# Patient Record
Sex: Male | Born: 1965 | Race: White | Hispanic: No | Marital: Married | Smoking: Never smoker
Health system: Southern US, Community
[De-identification: ages and names within clinical notes are randomized; demographics above are authoritative.]

## PROBLEM LIST (undated history)

## (undated) DIAGNOSIS — E1110 Type 2 diabetes mellitus with ketoacidosis without coma: Secondary | ICD-10-CM

## (undated) DIAGNOSIS — E119 Type 2 diabetes mellitus without complications: Secondary | ICD-10-CM

## (undated) DIAGNOSIS — I10 Essential (primary) hypertension: Secondary | ICD-10-CM

## (undated) DIAGNOSIS — R011 Cardiac murmur, unspecified: Secondary | ICD-10-CM

## (undated) DIAGNOSIS — B019 Varicella without complication: Secondary | ICD-10-CM

## (undated) DIAGNOSIS — F32A Depression, unspecified: Secondary | ICD-10-CM

## (undated) DIAGNOSIS — N200 Calculus of kidney: Secondary | ICD-10-CM

## (undated) DIAGNOSIS — F329 Major depressive disorder, single episode, unspecified: Secondary | ICD-10-CM

## (undated) DIAGNOSIS — E785 Hyperlipidemia, unspecified: Secondary | ICD-10-CM

## (undated) HISTORY — DX: Depression, unspecified: F32.A

## (undated) HISTORY — PX: CYSTOSCOPY WITH STENT PLACEMENT: SHX5790

## (undated) HISTORY — PX: RHINOPLASTY: SHX2354

## (undated) HISTORY — DX: Type 2 diabetes mellitus with ketoacidosis without coma: E11.10

## (undated) HISTORY — DX: Type 2 diabetes mellitus without complications: E11.9

## (undated) HISTORY — DX: Major depressive disorder, single episode, unspecified: F32.9

## (undated) HISTORY — DX: Calculus of kidney: N20.0

## (undated) HISTORY — DX: Varicella without complication: B01.9

## (undated) HISTORY — DX: Essential (primary) hypertension: I10

## (undated) HISTORY — DX: Cardiac murmur, unspecified: R01.1

## (undated) HISTORY — DX: Hyperlipidemia, unspecified: E78.5

## (undated) HISTORY — PX: ROTATOR CUFF REPAIR: SHX139

---

## 2014-10-24 DIAGNOSIS — E1110 Type 2 diabetes mellitus with ketoacidosis without coma: Secondary | ICD-10-CM

## 2014-10-24 HISTORY — DX: Type 2 diabetes mellitus with ketoacidosis without coma: E11.10

## 2015-08-12 ENCOUNTER — Encounter: Payer: Self-pay | Admitting: Primary Care

## 2015-08-12 ENCOUNTER — Ambulatory Visit (INDEPENDENT_AMBULATORY_CARE_PROVIDER_SITE_OTHER): Payer: BLUE CROSS/BLUE SHIELD | Admitting: Primary Care

## 2015-08-12 VITALS — BP 144/84 | HR 73 | Temp 98.0°F | Ht 70.0 in | Wt 163.8 lb

## 2015-08-12 DIAGNOSIS — I10 Essential (primary) hypertension: Secondary | ICD-10-CM | POA: Insufficient documentation

## 2015-08-12 DIAGNOSIS — Z794 Long term (current) use of insulin: Secondary | ICD-10-CM | POA: Diagnosis not present

## 2015-08-12 DIAGNOSIS — E119 Type 2 diabetes mellitus without complications: Secondary | ICD-10-CM

## 2015-08-12 DIAGNOSIS — E785 Hyperlipidemia, unspecified: Secondary | ICD-10-CM | POA: Diagnosis not present

## 2015-08-12 MED ORDER — INSULIN GLARGINE 100 UNIT/ML ~~LOC~~ SOLN: 20.0000 [IU] | Freq: Two times a day (BID) | SUBCUTANEOUS | Status: DC

## 2015-08-12 MED ORDER — LOSARTAN POTASSIUM 50 MG PO TABS: 50.0000 mg | ORAL_TABLET | Freq: Every day | ORAL | Status: DC

## 2015-08-12 NOTE — Patient Instructions (Signed)
Refills have been provided for your Insulin and Losartan.  Check your blood pressure daily, for the next 2 weeks. Ensure that you have rested for 30 minutes prior to checking your blood pressure. Call in/or send in your readings on My Chart in 2 weeks.  Follow up in 3 months for recheck of blood pressure and diabetes.   Please notify me if your hand numbness/pain does not resolve.  It was a pleasure to meet you today! Please don't hesitate to call me with any questions. Welcome to [facility]!

## 2015-08-12 NOTE — Assessment & Plan Note (Signed)
Long standing history.  Managed on Losartan 50 mg, but has been out for 2 months. BP elevated today, refills provided. He is to check his BP for the next 2 weeks and send them to me on My Chart. Will obtain records for lab work

## 2015-08-12 NOTE — Progress Notes (Signed)
Pre visit review using our clinic review tool, if applicable. No additional management support is needed unless otherwise documented below in the visit note. 

## 2015-08-12 NOTE — Assessment & Plan Note (Signed)
Managed on Simvastatin 40 mg.  Endorses normal lipid panel this summer. Will obtain records. Denies myalgias.

## 2015-08-12 NOTE — Assessment & Plan Note (Signed)
Diagnosed in 2009. Currently managed on Lantus 20 units BID but has been out since Sunday this week. Last eye exam in 11/2014, no diabetic retinopathy per patient. Managed on ARB and statin. Refills provided for insulin. Will do A1C in 3 months as he's been out of his insulin. Will obtain records.
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
## 2015-08-21 ENCOUNTER — Encounter: Payer: Self-pay | Admitting: Primary Care

## 2015-08-21 MED ORDER — "INSULIN SYRINGE 30G X 5/16"" 1 ML MISC": 0.2000 mL | Freq: Two times a day (BID) | Status: DC

## 2015-08-31 ENCOUNTER — Encounter: Payer: Self-pay | Admitting: Primary Care

## 2015-08-31 ENCOUNTER — Ambulatory Visit (INDEPENDENT_AMBULATORY_CARE_PROVIDER_SITE_OTHER)
Admission: RE | Admit: 2015-08-31 | Discharge: 2015-08-31 | Disposition: A | Discharge status: Home / Self Care | Payer: BLUE CROSS/BLUE SHIELD | Source: Ambulatory Visit | Attending: Primary Care | Admitting: Primary Care

## 2015-08-31 ENCOUNTER — Ambulatory Visit (INDEPENDENT_AMBULATORY_CARE_PROVIDER_SITE_OTHER): Payer: BLUE CROSS/BLUE SHIELD | Admitting: Primary Care

## 2015-08-31 VITALS — BP 132/86 | HR 92 | Temp 98.2°F | Wt 167.4 lb

## 2015-08-31 DIAGNOSIS — M25432 Effusion, left wrist: Secondary | ICD-10-CM | POA: Diagnosis not present

## 2015-08-31 DIAGNOSIS — M79641 Pain in right hand: Secondary | ICD-10-CM

## 2015-08-31 LAB — URIC ACID: URIC ACID, SERUM: 3.2 mg/dL — AB (ref 4.0–7.8)

## 2015-08-31 LAB — RHEUMATOID FACTOR: Rhuematoid fact SerPl-aCnc: <10 IU/mL (ref <=14)

## 2015-08-31 MED ORDER — GLUCOSE BLOOD VI STRP: ORAL_STRIP | Status: DC

## 2015-08-31 NOTE — Patient Instructions (Signed)
Complete xray(s) prior to leaving today. I will contact you regarding your results.  Schedule an appointment to see Dr. Patsy Lageropland for evaluation of probable trigger finger to the left hand.  Continue applying heat. Obtain a brace to help support your wrist and hand. Continue advil as needed.  It was a pleasure to see you today!  Trigger Finger Trigger finger (digital tendinitis and stenosing tenosynovitis) is a common disorder that causes an often painful catching of the fingers or thumb. It occurs as a clicking, snapping, or locking of a finger in the palm of the hand. This is caused by a problem with the tendons that flex or bend the fingers sliding smoothly through their sheaths. The condition may occur in any finger or a couple fingers at the same time.  The finger may lock with the finger curled or suddenly straighten out with a snap. This is more common in patients with rheumatoid arthritis and diabetes. Left untreated, the condition may get worse to the point where the finger becomes locked in flexion, like making a fist, or less commonly locked with the finger straightened out. CAUSES   Inflammation and scarring that lead to swelling around the tendon sheath.  Repeated or forceful movements.  Rheumatoid arthritis, an autoimmune disease that affects joints.  Gout.  Diabetes mellitus. SIGNS AND SYMPTOMS  Soreness and swelling of your finger.  A painful clicking or snapping as you bend and straighten your finger. DIAGNOSIS  Your health care provider will do a physical exam of your finger to diagnose trigger finger. TREATMENT   Splinting for 6-8 weeks may be helpful.  Nonsteroidal anti-inflammatory medicines (NSAIDs) can help to relieve the pain and inflammation.  Cortisone injections, along with splinting, may speed up recovery. Several injections may be required. Cortisone may give relief after one injection.  Surgery is another treatment that may be used if conservative  treatments do not work. Surgery can be minor, without incisions (a cut does not have to be made), and can be done with a needle through the skin.  Other surgical choices involve an open procedure in which the surgeon opens the hand through a small incision and cuts the pulley so the tendon can again slide smoothly. Your hand will still work fine. HOME CARE INSTRUCTIONS  Apply ice to the injured area, twice per day:  Put ice in a plastic bag.  Place a towel between your skin and the bag.  Leave the ice on for 20 minutes, 3-4 times a day.  Rest your hand often. MAKE SURE YOU:   Understand these instructions.  Will watch your condition.  Will get help right away if you are not doing well or get worse.   This information is not intended to replace advice given to you by your health care provider. Make sure you discuss any questions you have with your health care provider.   Document Released: 07/30/2004 Document Revised: 06/12/2013 Document Reviewed: 03/12/2013 Elsevier Interactive Patient Education [employer]2016 Elsevier Inc.

## 2015-08-31 NOTE — Progress Notes (Signed)
Pre visit review using our clinic review tool, if applicable. No additional management support is needed unless otherwise documented below in the visit note. 

## 2015-08-31 NOTE — Progress Notes (Signed)
Subjective:    Patient ID: Dakota Sutton, male    DOB: September 22, 1966, 49 y.o.   MRN: [id number]  HPI  Mr. Dakota Sutton is a 49 year old male who presents today with a chief complaint of wrist pain. His pain is located to his left wrist and right hand including his first digit. He "banged up" his left wrist in June this summer. He jammed his right thumb about one month ago at work and has noticed pain and swelling since. He's been soaking in warm bath tub, applying Aspercreme, taking advil, and applying heat with temporary improvement. He cannot open a soda bottle or hold a pen. Denies recent injury/trauma.  Review of Systems  Musculoskeletal: Positive for myalgias, joint swelling and arthralgias.  Skin: Negative for color change.       Past Medical History  Diagnosis Date  . Chicken pox   . Depression   . Diabetes mellitus without complication (HCC)   . Heart murmur   . Hyperlipidemia   . Hypertension   . Kidney stone   . Diabetic ketoacidosis (HCC) 10/2014    Hospitalized in [place]     Social History   Social History  . Marital Status: Married    Spouse Name: N/A  . Number of Children: N/A  . Years of Education: N/A   Occupational History  . Not on file.   Social History Main Topics  . Smoking status: Never Smoker   . Smokeless tobacco: Not on file  . Alcohol Use: 0.0 oz/week    0 Standard drinks or equivalent per week     Comment: social  . Drug Use: Not on file  . Sexual Activity: Not on file   Other Topics Concern  . Not on file   Social History Narrative   Moved from [place].   MBA. Works as a Production designer, theatre/television/film.   1 child that lives in [place].   Enjoys bowling, softball.       No past surgical history on file.  Family History  Problem Relation Age of Onset  . Heart disease Father     No Known Allergies  Current Outpatient Prescriptions on File Prior to Visit  Medication Sig Dispense Refill  . fluticasone (FLONASE) 50 MCG/ACT nasal spray Place 2 sprays into both  nostrils as needed for allergies or rhinitis.    Marland Kitchen insulin glargine (LANTUS) 100 UNIT/ML injection Inject 0.2 mLs (20 Units total) into the skin 2 (two) times daily. 10 mL 11  . Insulin Syringe-Needle U-100 (INSULIN SYRINGE 1CC/30GX5/16") 30G X 5/16" 1 ML MISC Inject 0.2 mLs as directed 2 (two) times daily. 100 each 0  . losartan (COZAAR) 50 MG tablet Take 1 tablet (50 mg total) by mouth daily. 90 tablet 1  . omeprazole (PRILOSEC) 20 MG capsule Take 20 mg by mouth daily.    . simvastatin (ZOCOR) 40 MG tablet Take 40 mg by mouth daily.     No current facility-administered medications on file prior to visit.    BP 132/86 mmHg  Pulse 92  Temp(Src) 98.2 F (36.8 C) (Oral)  Wt 167 lb 6.4 oz (75.932 kg)  SpO2 96%    Objective:   Physical Exam  Constitutional: He appears well-nourished.  Cardiovascular: Normal rate and regular rhythm.   Pulmonary/Chest: Effort normal and breath sounds normal.  Musculoskeletal:       Left wrist: He exhibits decreased range of motion, tenderness and swelling. He exhibits no bony tenderness.  Decrease in ROM to right 1st digit. Mild  swelling circumferentially to left wrist.  Skin: Skin is warm and dry. No erythema.          Assessment & Plan:  Wrist Pain:  Located to left wrist since June 2016. Difficulty opening bottles, holding pen, contraction of left second digit. Suspect trigger finger, will have him schedule an appointment with Dr. Patsy Lageropland for evaluation as conservative measures are not helping. Will obtain uric acid, ANA, RF to rule out autoimmune causes due to inflammation.  Thumb pain:  Located to right thumb since jamming it 1 month ago. Continues to have decrease ROM and swelling. Will obtain xray today and he does have some swelling and decrease in ROM.

## 2015-09-01 ENCOUNTER — Encounter: Payer: Self-pay | Admitting: *Deleted

## 2015-09-01 LAB — ANA: ANA: NEGATIVE

## 2015-09-02 ENCOUNTER — Ambulatory Visit (INDEPENDENT_AMBULATORY_CARE_PROVIDER_SITE_OTHER): Payer: BLUE CROSS/BLUE SHIELD | Admitting: Family Medicine

## 2015-09-02 VITALS — BP 118/80 | HR 84 | Temp 97.5°F | Ht 70.0 in | Wt 166.0 lb

## 2015-09-02 DIAGNOSIS — M6530 Trigger finger, unspecified finger: Secondary | ICD-10-CM

## 2015-09-02 DIAGNOSIS — M659 Synovitis and tenosynovitis, unspecified: Secondary | ICD-10-CM

## 2015-09-02 DIAGNOSIS — M6588 Other synovitis and tenosynovitis, other site: Secondary | ICD-10-CM

## 2015-09-02 DIAGNOSIS — M654 Radial styloid tenosynovitis [de Quervain]: Secondary | ICD-10-CM

## 2015-09-02 DIAGNOSIS — E119 Type 2 diabetes mellitus without complications: Secondary | ICD-10-CM

## 2015-09-02 DIAGNOSIS — Z794 Long term (current) use of insulin: Secondary | ICD-10-CM

## 2015-09-02 MED ORDER — DICLOFENAC SODIUM 1 % TD GEL: 2.0000 g | Freq: Four times a day (QID) | TRANSDERMAL | Status: DC

## 2015-09-02 MED ORDER — METHYLPREDNISOLONE ACETATE 40 MG/ML IJ SUSP
20.0000 mg | Freq: Once | INTRAMUSCULAR | Status: AC
  Administered 2015-09-02: 20 mg via INTRA_ARTICULAR

## 2015-09-02 NOTE — Progress Notes (Signed)
Pre visit review using our clinic review tool, if applicable. No additional management support is needed unless otherwise documented below in the visit note. 

## 2015-09-02 NOTE — Patient Instructions (Signed)
Bag of rice (you can also use sand) in a big bucket.  Practice your grips - particularly extension of fingers  For a few minutes each day  (Can look up on youtube)

## 2015-09-02 NOTE — Progress Notes (Signed)
Dr. Karleen Hampshire T. Denetria Luevanos, MD, CAQ Sports Medicine Primary [facility] and Sports Medicine [address] [place], [address] Phone: [phone] Fax: [phone]  09/02/2015  Dakota Sutton: Dakota Sutton, MRN: [id number], DOB: [birth year], 49 y.o.  Primary Physician:  Morrie Sheldon, NP   Chief Complaint  Dakota Sutton presents with  . Wrist Pain    Bilateral  . Trigger Finger    Left Pinky   Subjective:   Dear Dakota Sutton:  Thank you for having me see Dakota Sutton in consultation today at [facility] for Dakota Sutton problem with B hand pain.  As you may recall, Dakota Sutton is a 49 y.o. year old male with a history of B hand pain, worse about the thumbs. Dakota Sutton recently moved here from [place], and Dakota Sutton is doing a much more physical job that Dakota Sutton was in [place]. Dakota Sutton is using Dakota Sutton hands more and is primarly having pain on the dorsum of the L thumb and volarly on the R thumb.   A 5th digit trigger finger was found when you saw Dakota Sutton in the office too, and that is not really painful or bothering Dakota Sutton much right now.   Dakota Sutton has tried some OTC NSAIDS, tylenol, ice, heat, but no splinting, no PT. No history of surgery.   Dakota Sutton does relate a history of using some type of machine at work, and since then it has been bothering Dakota Sutton more in the hands.    Dakota Sutton Active Problem List   Diagnosis Date Noted  . Type 2 diabetes mellitus without complication, with long-term current use of insulin (HCC) 08/12/2015  . Essential hypertension 08/12/2015  . Hyperlipidemia LDL goal <100 08/12/2015    Past Medical History  Diagnosis Date  . Chicken pox   . Depression   . Diabetes mellitus without complication (HCC)   . Heart murmur   . Hyperlipidemia   . Hypertension   . Kidney stone   . Diabetic ketoacidosis (HCC) 10/2014    Hospitalized in [place]     No past surgical history on file.  Social History   Social History  . Marital Status: Married    Spouse Name: N/A  . Number of Children: N/A  . Years  of Education: N/A   Occupational History  . Not on file.   Social History Main Topics  . Smoking status: Never Smoker   . Smokeless tobacco: Not on file  . Alcohol Use: 0.0 oz/week    0 Standard drinks or equivalent per week     Comment: social  . Drug Use: Not on file  . Sexual Activity: Not on file   Other Topics Concern  . Not on file   Social History Narrative   Moved from [place].   MBA. Works as a Production designer, theatre/television/film.   1 child that lives in [place].   Enjoys bowling, softball.       Family History  Problem Relation Age of Onset  . Heart disease Father     No Known Allergies  Medication list reviewed and updated in full in [facility].   GEN: No fevers, chills. Nontoxic. Primarily MSK c/o today. MSK: Detailed in the HPI GI: tolerating PO intake without difficulty Neuro: No numbness, parasthesias, or tingling associated. Otherwise the pertinent positives of the ROS are noted above.   Objective:   BP 118/80 mmHg  Pulse 84  Temp(Src) 97.5 F (36.4 C) (Oral)  Ht  (1.778 m)  Wt 166 lb (75.297 kg)  BMI 23.82  kg/m2    GEN: WDWN, NAD, Non-toxic, A & O x 3 HEENT: Atraumatic, Normocephalic. Neck supple. No masses, No LAD. Ears and Nose: No external deformity. CV: RRR, No M/G/R. No JVD. No thrill. No extra heart sounds. PULM: CTA B, no wheezes, crackles, rhonchi. No retractions. No resp. distress. No accessory muscle use. EXTR: No c/c/e NEURO Normal gait.  PSYCH: Normally interactive. Conversant. Not depressed or anxious appearing.  Calm demeanor.    Hand: B Ecchymosis or edema: neg ROM wrist/hand/digits/elbow: TERMINAL MOTION OF WRIST CAUSES SOME PAIN Carpals, MCP's, digits: NT Distal Ulna and Radius: NT Supination lift test: neg Ecchymosis or edema: neg Cysts/nodules: L 5TH WITH SOME MODERATE TRIGGERING Finkelstein's test: POS ON LEFT Snuffbox tenderness: neg Scaphoid tubercle: NT Hook of Hamate: NT Resisted supination: NT Full composite fist Grip, all  digits: 5/5 str PAIN ALONG THE 1ST R FLEXOR TENDON Axial load test: neg Phalen's: neg Tinel's: neg Atrophy: neg  Hand sensation: intact   Radiology:  Dg Hand Complete Right  08/31/2015  CLINICAL DATA:  Right thumb and hand pain since injury 1 month ago. EXAM: RIGHT HAND - COMPLETE 3+ VIEW COMPARISON:  None. FINDINGS: No acute fracture or dislocation. Joint spaces are maintained for age. IMPRESSION: No acute osseous abnormality. Electronically Signed   By: Jeronimo GreavesKyle  Talbot M.D.   On: 08/31/2015 09:04    Assessment and Plan:   Flexor tenosynovitis of thumb  Tenosynovitis of finger - Plan: methylPREDNISolone acetate (DEPO-MEDROL) injection 20 mg  De Quervain's tenosynovitis, left  Trigger finger, left  Type 2 diabetes mellitus without complication, with long-term current use of insulin (HCC)   Suspect all overuse since starting a more manual job with 1st volar compartmental tenosynovitis of the R and De Quervain's primarily on the LEFT. Inject the R 1st flexor sheath.   Thumb spica splint for left, begin rehab, and if Dakota Sutton does poorly, we can inject, but I would like to use with US assistance.   Work on classic [employer] rehab.  R 1st tendon sheath injection, thumb Verbal consent was obtained. Risks (including rare risk of infection, potential risk for skin lightening and potential atrophy), benefits and alternatives were discussed. Prepped with Chloraprep and Ethyl Chloride used for anesthesia. Under sterile conditions, Dakota Sutton injected at palmar crease aiming distally with 45 degree angle; injected directly into tendon sheath. Medication flowed freely without resistance.  Needle size: 22 gauge 1 1/2 inch Injection: 1/2 cc of Lidocaine 1% and Depo-Medrol 20 mg   Follow-up: 1 mo  New Prescriptions   DICLOFENAC SODIUM (VOLTAREN) 1 % GEL    Apply 2 g topically 4 (four) times daily.   Dakota Sutton Instructions  Bag of rice (you can also use sand) in a big bucket.  Practice your grips -  particularly extension of fingers  For a few minutes each day  (Can look up on youtube)    Thank you for having us see Dakota Sutton in consultation.  Feel free to contact me with any questions.  Signed,  Elpidio GaleaSpencer T. Brittan Mapel, MD   Dakota Sutton's Medications  New Prescriptions   DICLOFENAC SODIUM (VOLTAREN) 1 % GEL    Apply 2 g topically 4 (four) times daily.  Previous Medications   FLUTICASONE (FLONASE) 50 MCG/ACT NASAL SPRAY    Place 2 sprays into both nostrils as needed for allergies or rhinitis.   GLUCOSE BLOOD TEST STRIP    Use as instructed to test glucose daily.   INSULIN GLARGINE (LANTUS) 100 UNIT/ML INJECTION    Inject 0.2  mLs (20 Units total) into the skin 2 (two) times daily.   INSULIN SYRINGE-NEEDLE U-100 (INSULIN SYRINGE 1CC/30GX5/16") 30G X 5/16" 1 ML MISC    Inject 0.2 mLs as directed 2 (two) times daily.   LOSARTAN (COZAAR) 50 MG TABLET    Take 1 tablet (50 mg total) by mouth daily.   OMEPRAZOLE (PRILOSEC) 20 MG CAPSULE    Take 20 mg by mouth daily.   SIMVASTATIN (ZOCOR) 40 MG TABLET    Take 40 mg by mouth daily.  Modified Medications   No medications on file  Discontinued Medications   No medications on file

## 2015-09-03 ENCOUNTER — Telehealth: Payer: Self-pay

## 2015-09-03 NOTE — Telephone Encounter (Signed)
PA forms placed in Dr. Cyndie Chimeopland's in box to complete.

## 2015-09-03 NOTE — Telephone Encounter (Signed)
done

## 2015-09-03 NOTE — Telephone Encounter (Signed)
Pt left v/m; pt spoke with ins co and request call to ins prior auth dept [id number]. Voltaren gel requires PA.

## 2015-09-03 NOTE — Telephone Encounter (Signed)
[facility]

## 2015-09-04 ENCOUNTER — Encounter: Payer: Self-pay | Admitting: Family Medicine

## 2015-09-04 NOTE — Telephone Encounter (Signed)
PA form completed and [phone].

## 2015-09-07 NOTE — Telephone Encounter (Signed)
Voltaren Gel Approved.  Effective 09/04/2015-10/23/2038.  Mr. Dakota Sutton notified.

## 2015-09-08 ENCOUNTER — Encounter: Payer: Self-pay | Admitting: Primary Care

## 2015-09-09 MED ORDER — INSULIN GLARGINE 100 UNIT/ML SOLOSTAR PEN
20.0000 [IU] | PEN_INJECTOR | Freq: Every day | SUBCUTANEOUS | Status: DC
Start: 2015-09-09 — End: 2015-09-10

## 2015-09-09 NOTE — Addendum Note (Signed)
Addended by: Sueanne MargaritaSMITH, Hristopher Missildine L on: 09/09/2015 07:48 AM   Modules accepted: Orders, Medications

## 2015-09-10 ENCOUNTER — Encounter: Payer: Self-pay | Admitting: Primary Care

## 2015-09-10 ENCOUNTER — Telehealth: Payer: Self-pay

## 2015-09-10 ENCOUNTER — Other Ambulatory Visit: Payer: Self-pay | Admitting: *Deleted

## 2015-09-10 MED ORDER — INSULIN PEN NEEDLE 32G X 4 MM MISC: Status: DC

## 2015-09-10 MED ORDER — INSULIN GLARGINE 100 UNIT/ML SOLOSTAR PEN: 20.0000 [IU] | PEN_INJECTOR | Freq: Two times a day (BID) | SUBCUTANEOUS | Status: DC

## 2015-09-10 NOTE — Telephone Encounter (Signed)
Dakota Sutton (DPR signed) left v/m that lantus was sent in and wants updated instructions for 20 U bid; [facility]. I spoke with Dakota Sutton and advised done and also sent in pen needles that were advised by [facility] pharmacist. Galen DaftJanette appreciative and will pick up med for pt at [facility] [address].

## 2015-10-21 ENCOUNTER — Encounter: Payer: Self-pay | Admitting: Primary Care

## 2015-10-23 MED ORDER — OMEPRAZOLE 20 MG PO CPDR: 20.0000 mg | DELAYED_RELEASE_CAPSULE | Freq: Every day | ORAL | Status: DC

## 2015-10-23 MED ORDER — FLUTICASONE PROPIONATE 50 MCG/ACT NA SUSP: 2.0000 | NASAL | Status: DC | PRN

## 2015-10-23 MED ORDER — INSULIN PEN NEEDLE 30G X 8 MM MISC: 1.0000 | Status: DC | PRN

## 2015-10-23 NOTE — Telephone Encounter (Signed)
Okay to refill needles as requested.

## 2015-10-23 NOTE — Telephone Encounter (Signed)
Pt called and when he gets 5 pens at [facility] he is charged 2 copays because 5 pens is 5 weeks of med and pharmacy will not break a box of pens; cost to pt is $200.00. Pt request to increase the dosage of lantus even thou pt will not take the increased dose (pt will continue taking the 20 U bid) so that his co pay will only be $100.00. Advised pt that would be illegal to do that; pt said OK then he just will not take the lantus. (pt is presently out of lantus; pt still has humalog and will take humalog if cannot get lantus. FBS was running in the 200's over holiday;now pt is back home and FBS this morning was 78. Pt said when not on vacation FBS usually runs in 130's. Pt request new rx for Novofine 30G x 8mm disposable needles. Pt does not want the 32G 4 mm on med list.Please advise. Refilled x 1 the flonase and omeprazole to [facility] [address]. Pt has appt to see Mayra ReelKate Clark NP on 11/12/15. Pt established care 08/12/15.pt request cb. Allayne GitelmanK Clark NP out of office until 10/27/15.

## 2015-10-27 MED ORDER — FLUTICASONE PROPIONATE 50 MCG/ACT NA SUSP: 2.0000 | Freq: Every day | NASAL | Status: AC | PRN

## 2015-10-28 ENCOUNTER — Telehealth: Payer: Self-pay

## 2015-10-28 MED ORDER — INSULIN PEN NEEDLE 30G X 8 MM MISC: Status: DC

## 2015-10-28 NOTE — Telephone Encounter (Signed)
Pt said the instructions on the pen needles are not correct. Pt is taking 20 U insulin twice a day; apologized to pt and sending new rx to [facility] [address].[facility] to [facility] other rx for pen needles. Pt voiced understanding.

## 2015-11-02 ENCOUNTER — Emergency Department (HOSPITAL_COMMUNITY): Payer: BLUE CROSS/BLUE SHIELD

## 2015-11-02 ENCOUNTER — Emergency Department (HOSPITAL_COMMUNITY)
Admission: EM | Admit: 2015-11-02 | Discharge: 2015-11-02 | Disposition: A | Discharge status: Home / Self Care | Payer: BLUE CROSS/BLUE SHIELD | Attending: Emergency Medicine | Admitting: Emergency Medicine

## 2015-11-02 ENCOUNTER — Encounter (HOSPITAL_COMMUNITY): Payer: Self-pay | Admitting: *Deleted

## 2015-11-02 DIAGNOSIS — I10 Essential (primary) hypertension: Secondary | ICD-10-CM | POA: Insufficient documentation

## 2015-11-02 DIAGNOSIS — E119 Type 2 diabetes mellitus without complications: Secondary | ICD-10-CM | POA: Diagnosis not present

## 2015-11-02 DIAGNOSIS — Z87442 Personal history of urinary calculi: Secondary | ICD-10-CM | POA: Insufficient documentation

## 2015-11-02 DIAGNOSIS — E785 Hyperlipidemia, unspecified: Secondary | ICD-10-CM | POA: Diagnosis not present

## 2015-11-02 DIAGNOSIS — Z791 Long term (current) use of non-steroidal anti-inflammatories (NSAID): Secondary | ICD-10-CM | POA: Diagnosis not present

## 2015-11-02 DIAGNOSIS — R000 Tachycardia, unspecified: Secondary | ICD-10-CM | POA: Diagnosis not present

## 2015-11-02 DIAGNOSIS — R011 Cardiac murmur, unspecified: Secondary | ICD-10-CM | POA: Insufficient documentation

## 2015-11-02 DIAGNOSIS — Z79899 Other long term (current) drug therapy: Secondary | ICD-10-CM | POA: Diagnosis not present

## 2015-11-02 DIAGNOSIS — Z794 Long term (current) use of insulin: Secondary | ICD-10-CM | POA: Insufficient documentation

## 2015-11-02 DIAGNOSIS — Z8659 Personal history of other mental and behavioral disorders: Secondary | ICD-10-CM | POA: Insufficient documentation

## 2015-11-02 DIAGNOSIS — R0602 Shortness of breath: Secondary | ICD-10-CM | POA: Diagnosis not present

## 2015-11-02 DIAGNOSIS — R110 Nausea: Secondary | ICD-10-CM | POA: Insufficient documentation

## 2015-11-02 DIAGNOSIS — R61 Generalized hyperhidrosis: Secondary | ICD-10-CM | POA: Diagnosis not present

## 2015-11-02 DIAGNOSIS — Z8619 Personal history of other infectious and parasitic diseases: Secondary | ICD-10-CM | POA: Diagnosis not present

## 2015-11-02 LAB — I-STAT CHEM 8, ED
BUN: 24 mg/dL — AB (ref 6–20)
CALCIUM ION: 1.12 mmol/L (ref 1.12–1.23)
CHLORIDE: 106 mmol/L (ref 101–111)
CREATININE: 1.3 mg/dL — AB (ref 0.61–1.24)
GLUCOSE: 142 mg/dL — AB (ref 65–99)
HCT: 51 % (ref 39.0–52.0)
Hemoglobin: 17.3 g/dL — ABNORMAL HIGH (ref 13.0–17.0)
Potassium: 3.7 mmol/L (ref 3.5–5.1)
Sodium: 142 mmol/L (ref 135–145)
TCO2: 20 mmol/L (ref 0–100)

## 2015-11-02 LAB — COMPREHENSIVE METABOLIC PANEL
ALK PHOS: 90 U/L (ref 38–126)
ALT: 19 U/L (ref 17–63)
ANION GAP: 12 (ref 5–15)
AST: 22 U/L (ref 15–41)
Albumin: 3.8 g/dL (ref 3.5–5.0)
BILIRUBIN TOTAL: 0.8 mg/dL (ref 0.3–1.2)
BUN: 21 mg/dL — ABNORMAL HIGH (ref 6–20)
CALCIUM: 9.5 mg/dL (ref 8.9–10.3)
CO2: 21 mmol/L — ABNORMAL LOW (ref 22–32)
Chloride: 108 mmol/L (ref 101–111)
Creatinine, Ser: 1.45 mg/dL — ABNORMAL HIGH (ref 0.61–1.24)
GFR, EST NON AFRICAN AMERICAN: 54 mL/min — AB (ref >60)
Glucose, Bld: 146 mg/dL — ABNORMAL HIGH (ref 65–99)
Potassium: 3.8 mmol/L (ref 3.5–5.1)
SODIUM: 141 mmol/L (ref 135–145)
TOTAL PROTEIN: 6.8 g/dL (ref 6.5–8.1)

## 2015-11-02 LAB — CBC
HCT: 47.3 % (ref 39.0–52.0)
Hemoglobin: 16.7 g/dL (ref 13.0–17.0)
MCH: 31.9 pg (ref 26.0–34.0)
MCHC: 35.3 g/dL (ref 30.0–36.0)
MCV: 90.4 fL (ref 78.0–100.0)
PLATELETS: 258 10*3/uL (ref 150–400)
RBC: 5.23 MIL/uL (ref 4.22–5.81)
RDW: 12.8 % (ref 11.5–15.5)
WBC: 7.8 10*3/uL (ref 4.0–10.5)

## 2015-11-02 LAB — CBG MONITORING, ED: GLUCOSE-CAPILLARY: 155 mg/dL — AB (ref 65–99)

## 2015-11-02 LAB — I-STAT TROPONIN, ED: Troponin i, poc: 0 ng/mL (ref 0.00–0.08)

## 2015-11-02 MED ORDER — IOHEXOL 350 MG/ML SOLN
100.0000 mL | Freq: Once | INTRAVENOUS | Status: AC | PRN
  Administered 2015-11-02: 75 mL via INTRAVENOUS

## 2015-11-02 MED ORDER — SODIUM CHLORIDE 0.9 % IV BOLUS (SEPSIS)
1000.0000 mL | Freq: Once | INTRAVENOUS | Status: AC
  Administered 2015-11-02: 1000 mL via INTRAVENOUS

## 2015-11-02 NOTE — ED Notes (Signed)
Pt ambulated down hallway with O2 sats remaining above 97% the entire time. Gait steady. Denies shortness of breath.

## 2015-11-02 NOTE — ED Provider Notes (Signed)
CSN: [id number]     Arrival date & time 11/02/15  0759 History   First MD Initiated Contact with Patient 11/02/15 0801     Chief Complaint  Patient presents with  . Shortness of Breath     (Consider location/radiation/quality/duration/timing/severity/associated sxs/prior Treatment) HPI Dakota Sutton is a 50 y.o. male with a history of hypertension, hyperlipidemia, diabetes, comes in for evaluation of short of breath. Patient reports that approximately 10 after 5 this morning, while getting out of the shower he suddenly felt like "I ran a marathon" and reports sudden shortness of breath. He had associated nausea, diaphoresis. No syncope. Denies any chest pain, vomiting, cough, hemoptysis, leg pain or swelling, numbness or weakness, vision changes, headache. He reports a similar episode happened last week, lasted approximately 20 minutes. Patient is still currently short of breath in the emergency department. He does report flying to [place] on December 20, flying back on December 26. Nonsmoker. Reports father had a history of mitral valve prolapse, but no other first degree cardiopulmonary problems.  Past Medical History  Diagnosis Date  . Chicken pox   . Depression   . Diabetes mellitus without complication (HCC)   . Heart murmur   . Hyperlipidemia   . Hypertension   . Kidney stone   . Diabetic ketoacidosis (HCC) 10/2014    Hospitalized in [place]    History reviewed. No pertinent past surgical history. Family History  Problem Relation Age of Onset  . Heart disease Father    Social History  Substance Use Topics  . Smoking status: Never Smoker   . Smokeless tobacco: None  . Alcohol Use: 0.0 oz/week    0 Standard drinks or equivalent per week     Comment: social    Review of Systems A 10 point review of systems was completed and was negative except for pertinent positives and negatives as mentioned in the history of present illness     Allergies  Review of patient's  allergies indicates no known allergies.  Home Medications   Prior to Admission medications   Medication Sig Start Date End Date Taking? Authorizing Provider  Cinnamon 500 MG TABS Take 500 mg by mouth daily.   Yes Historical Provider, MD  diclofenac sodium (VOLTAREN) 1 % GEL Apply 2 g topically 4 (four) times daily. 09/02/15  Yes Spencer Copland, MD  fluticasone (FLONASE) 50 MCG/ACT nasal spray Place 2 sprays into both nostrils daily as needed for allergies or rhinitis. 10/27/15  Yes Doreene NestKatherine K Clark, NP  glucose blood test strip Use as instructed to test glucose daily. 08/31/15  Yes Doreene NestKatherine K Clark, NP  Insulin Glargine (LANTUS SOLOSTAR) 100 UNIT/ML Solostar Pen Inject 20 Units into the skin 2 (two) times daily. 09/10/15  Yes Karie Schwalbeichard I Letvak, MD  Insulin Pen Needle (NOVOFINE) 30G X 8 MM MISC Pt is to inject 20 Units of insulin twice a day. 10/28/15  Yes Doreene NestKatherine K Clark, NP  losartan (COZAAR) 50 MG tablet Take 1 tablet (50 mg total) by mouth daily. 08/12/15  Yes Doreene NestKatherine K Clark, NP  Multiple Vitamins-Minerals (MULTIVITAMIN WITH MINERALS) tablet Take 1 tablet by mouth daily.   Yes Historical Provider, MD  omeprazole (PRILOSEC) 20 MG capsule Take 1 capsule (20 mg total) by mouth daily. Patient taking differently: Take 20 mg by mouth daily as needed (heartburn).  10/23/15  Yes Doreene NestKatherine K Clark, NP  simvastatin (ZOCOR) 40 MG tablet Take 40 mg by mouth daily.   Yes Historical Provider, MD   BP 120/77 mmHg  Pulse 84  Temp(Src) 97.7 F (36.5 C) (Oral)  Resp 18  SpO2 98% Physical Exam  Constitutional: He is oriented to person, place, and time. He appears well-developed and well-nourished.  HENT:  Head: Normocephalic and atraumatic.  Mouth/Throat: Oropharynx is clear and moist.  Eyes: Conjunctivae are normal. Pupils are equal, round, and reactive to light. Right eye exhibits no discharge. Left eye exhibits no discharge. No scleral icterus.  Neck: Neck supple. No JVD present. No tracheal  deviation present.  Cardiovascular: Regular rhythm and normal heart sounds.   Tachycardic with normal heart sounds. No JVD.  Pulmonary/Chest: Effort normal and breath sounds normal. No respiratory distress. He has no wheezes. He has no rales.  Patient appears slightly uncomfortable. Slightly tachypneic. Maintaining oxygen saturations at 100% on room air. No adventitious lung sounds, lungs are clear and appropriate chest rise and expansion with respiration.  Abdominal: Soft. There is no tenderness.  Musculoskeletal: Normal range of motion. He exhibits no edema or tenderness.  Neurological: He is alert and oriented to person, place, and time.  Cranial Nerves II-XII grossly intact  Skin: Skin is warm and dry. No rash noted.  Psychiatric: He has a normal mood and affect.  Nursing note and vitals reviewed.   ED Course  Procedures (including critical care time) Labs Review Labs Reviewed  COMPREHENSIVE METABOLIC PANEL - Abnormal; Notable for the following:    CO2 21 (*)    Glucose, Bld 146 (*)    BUN 21 (*)    Creatinine, Ser 1.45 (*)    GFR calc non Af Amer 54 (*)    All other components within normal limits  CBG MONITORING, ED - Abnormal; Notable for the following:    Glucose-Capillary 155 (*)    All other components within normal limits  I-STAT CHEM 8, ED - Abnormal; Notable for the following:    BUN 24 (*)    Creatinine, Ser 1.30 (*)    Glucose, Bld 142 (*)    Hemoglobin 17.3 (*)    All other components within normal limits  CBC  URINE RAPID DRUG SCREEN, HOSP PERFORMED  I-STAT TROPOININ, ED    Imaging Review Dg Chest 2 View  11/02/2015  CLINICAL DATA:  Short of breath EXAM: CHEST  2 VIEW COMPARISON:  None. FINDINGS: The heart size and mediastinal contours are within normal limits. Both lungs are clear. The visualized skeletal structures are unremarkable. IMPRESSION: No active cardiopulmonary disease. Electronically Signed   By: Marlan Palau M.D.   On: 11/02/2015 08:55   Ct  Angio Chest Pe W/cm &/or Wo Cm  11/02/2015  ADDENDUM REPORT: 11/02/2015 11:22 ADDENDUM: There should also be an Impression # 4 which reads as follows: 4. Right lower lobe lateral segment 4 mm lung nodule. If the patient is at high risk for bronchogenic carcinoma, follow-up chest CT at 1 year is recommended. If the patient is at low risk, no follow-up is needed. This recommendation follows the consensus statement: Guidelines for Management of Small Pulmonary Nodules Detected on CT Scans: A Statement from the Fleischner Society as published in Radiology 2005; 237:395-400. This addition was discussed by telephone with Dr. Eber Hong on 11/02/2015 at 1117 hours. Electronically Signed   By: Odessa Fleming M.D.   On: 11/02/2015 11:22  11/02/2015  CLINICAL DATA:  50 year old male with sudden onset shortness of Breath and dizziness at 0445 hours. Initial encounter. EXAM: CT ANGIOGRAPHY CHEST WITH CONTRAST TECHNIQUE: Multidetector CT imaging of the chest was performed using the standard protocol during  bolus administration of intravenous contrast. Multiplanar CT image reconstructions and MIPs were obtained to evaluate the vascular anatomy. CONTRAST:  75mL OMNIPAQUE IOHEXOL 350 MG/ML SOLN COMPARISON:  Chest radiographs 0826 hours today. FINDINGS: Adequate contrast bolus timing in the pulmonary arterial tree. No focal filling defect identified in the pulmonary arterial tree to suggest the presence of acute pulmonary embolism. Major airways are patent. Mild patchy and dependent bilateral lower lobe pulmonary opacity. 4 mm right lower lobe pulmonary nodule on series 5, image 87. Adjacent tiny triangular thickening of the right fissure likely is benign (such as a small pleural node). Mild scarring or atelectasis in the medial segment of the right middle lobe. No other abnormal pulmonary opacity. No pleural effusion. Cardiomegaly. No pericardial effusion. No calcified coronary artery atherosclerosis is evident. No significant calcified  aortic atherosclerosis. 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 Negative thoracic inlet. No mediastinal or hilar lymphadenopathy. No axillary lymphadenopathy. Negative visualized liver, spleen, pancreas, adrenal glands, and bowel in the upper abdomen. No acute osseous abnormality identified. Review of the MIP images confirms the above findings. IMPRESSION: 1.  No evidence of acute pulmonary embolus. 2. Mild nonspecific lower lobe and depending ground-glass opacity, might simply reflect atelectasis. Otherwise consider viral/atypical infectious alveolitis. No pleural effusion. 3. Cardiomegaly. 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 Electronically Signed: By: Odessa Fleming M.D. On: 11/02/2015 10:30   I have personally reviewed and evaluated these images and lab results as part of my medical decision-making.   EKG Interpretation   Date/Time:  Monday November 02 2015 11:10:58 EST Ventricular Rate:  88 PR Interval:  174 QRS Duration: 114 QT Interval:  356 QTC Calculation: 431 R Axis:   84 Text Interpretation:  Sinus rhythm Probable left atrial enlargement  Incomplete right bundle branch block Lateral leads are also involved since  last tracing no significant change Confirmed by MILLER  MD, BRIAN ([id number])  on 11/02/2015 11:20:44 AM     ED ECG REPORT   Date: 11/02/2015, 07:59  Rate: 116  Rhythm: sinus tachycardia  QRS Axis: normal  Intervals: normal  ST/T Wave abnormalities: flattened p waves  Conduction Disutrbances:nonspecific intraventricular conduction delay  Narrative Interpretation: Sinus tachycardia , NO STEMI  Old EKG Reviewed: none available  I have personally reviewed the EKG tracing and disagree with the computerized printout as noted.  MDM  Dakota Sutton is a 50 y.o. male with a history of hypertension, hyperlipidemia, diabetes, comes in for evaluation of shortness of  breath. Patient reports he has had same symptoms in the past, most recently 1 week ago. Reports sudden onset shortness of breath after getting out of the shower today. He also reports having recently flown to and from [place] at the end of December. On arrival, patient is hemodynamically stable with mild tachycardia, afebrile, normotensive and 100% on room air. He has an unremarkable cardiopulmonary exam. Otherwise grossly normal physical exam. Plan to obtain EKG, basic labs, chest x-ray as well as CT angiography of chest to evaluate for possible PE.  EKG shows sinus tachycardia with no WPW or other concerning findings.  Repeat EKG shows normal sinus rhythm with a rate of 88.  Upon reevaluation, patient reports he feels much better and denies any discomfort. Vital signs/heart rate have improved without intervention.    Labs are grossly unremarkable. Troponin negative. CT shows no acute pulmonary embolus. Incidental mild cardiomegaly as well as 4 mm pulmonary nodule. Discussed results  of CT scan and pulmonary nodule and recommended follow-up CT scan in 1 year as well as discuss with PCP, patient verbalizes understanding and agrees. Patient maintains no chest pain, numbness or weakness, headaches, vision changes, cough or other concerning medical symptoms. Symptoms have resolved in the ED, no emergent cause elucidated. Low suspicion for ACS. Discussed follow-up with PCP next week and patient agrees. Also given referral to cardiology for possible Holter monitoring.   Prior to discharge, I discussed and reviewed this case with my attending, Dr. Hyacinth Meeker who also saw and evaluated the patient and agrees with above plan. Final diagnoses:  Shortness of breath        Joycie Peek, PA-C 11/02/15 1502  Eber Hong, MD 11/03/15 1157

## 2015-11-02 NOTE — ED Notes (Signed)
MD at bedside. 

## 2015-11-02 NOTE — ED Notes (Signed)
Per EMS- pt began feeling SOB this morning at 445am. Pt reports getting out of the shower and "feeling like i ran a marathon". Pt states that he continued getting ready and went to work but felt lightheaded. Denies pain

## 2015-11-02 NOTE — ED Provider Notes (Signed)
The pt is a 50 y/o male - has hx of DM - admitted last year for DKA to hospital in [place] and had prolonged ICU stay and w/u for tachycardia without answer / reason - now presents with recurrent feeling of palpitations with SOB that is intermittent.  On exam he has pulse of 85, normal pulses, no edema, clear lungs.  Labs, CXR, ECG X 2 and CT reviewed - no abnormal findings to suggest cause though has nodule - pt will be informed prior to d/c.  Needs f/u for holter monitor and local cardiology referral.  Pt in agreement and is asymptomatic at this time.  Meds given in ED:  Medications  sodium chloride 0.9 % bolus 1,000 mL (0 mLs Intravenous Stopped 11/02/15 1138)  iohexol (OMNIPAQUE) 350 MG/ML injection 100 mL (75 mLs Intravenous Contrast Given 11/02/15 1010)    Discharge Medication List as of 11/02/2015 12:34 PM      Medical screening examination/treatment/procedure(s) were conducted as a shared visit with non-physician practitioner(s) and myself.  I personally evaluated the patient during the encounter.  Clinical Impression:   Final diagnoses:  Shortness of breath         Eber HongBrian Mehgan Santmyer, MD 11/03/15 1158

## 2015-11-02 NOTE — Discharge Instructions (Signed)
You were evaluated in the ED today for your shortness of breath. There does not appear to be an emergent cause for your symptoms at this time. Your exam was reassuring as were labs, chest x-ray and CT scan of your chest. There does not appear to be any emergent infection or blood clot. Please follow-up with cardiology in the next 2-3 days for reevaluation and definitive care. Return to ED for any new or worsening symptoms as we discussed.  Shortness of Breath Shortness of breath means you have trouble breathing. Shortness of breath needs medical care right away. HOME CARE   Do not smoke.  Avoid being around chemicals or things (paint fumes, dust) that may bother your breathing.  Rest as needed. Slowly begin your normal activities.  Only take medicines as told by your doctor.  Keep all doctor visits as told. GET HELP RIGHT AWAY IF:   Your shortness of breath gets worse.  You feel lightheaded, pass out (faint), or have a cough that is not helped by medicine.  You cough up blood.  You have pain with breathing.  You have pain in your chest, arms, shoulders, or belly (abdomen).  You have a fever.  You cannot walk up stairs or exercise the way you normally do.  You do not get better in the time expected.  You have a hard time doing normal activities even with rest.  You have problems with your medicines.  You have any new symptoms. MAKE SURE YOU:  Understand these instructions.  Will watch your condition.  Will get help right away if you are not doing well or get worse.   This information is not intended to replace advice given to you by your health care provider. Make sure you discuss any questions you have with your health care provider.   Document Released: 03/28/2008 Document Revised: 10/15/2013 Document Reviewed: 12/26/2011 Elsevier Interactive Patient Education [employer]2016 Elsevier Inc.

## 2015-11-04 ENCOUNTER — Encounter: Payer: Self-pay | Admitting: Cardiovascular Disease

## 2015-11-04 NOTE — Progress Notes (Signed)
Patient ID: Dakota Sutton, male   DOB: 01/30/1966, 49 y.o.   MRN: [id number]     Cardiology Office Note   Date:  11/05/2015   ID:  Dakota Sutton, DOB 03/31/1966, MRN [id number]  PCP:  Clark,Katherine Kendal, NP  Cardiologist:   Sefora Tietje, MD   Chief Complaint  Patient presents with  . Establish Care    ESP for SOB      History of Present Illness: Dakota Sutton is a 49 y.o.  male with a history of hypertension, hyperlipidemia, diabetes seen in ER 11/02/15 evaluation of short of breath. Patient reports that approximately AM of ER visit  while getting out of the shower he suddenly felt like "I ran a marathon" and reports sudden shortness of breath. He had associated nausea, diaphoresis. No syncope. Denies any chest pain, vomiting, cough, hemoptysis, leg pain or swelling, numbness or weakness, vision changes, headache. He reports a similar episode happened last week, lasted approximately 20 minutes. Patient is still currently short of breath in the emergency department. He does report flying to [place] on December 20, flying back on December 26.  Nonsmoker. Reports father had a history of mitral valve prolapse, but no other first degree cardiopulmonary problems.  Reviewed w/u:  CXR: NAD CTA:  IMPRESSION: 1. No evidence of acute pulmonary embolus. 2. Mild nonspecific lower lobe and depending ground-glass opacity, might simply reflect atelectasis. Otherwise consider viral/atypical infectious alveolitis. No pleural effusion. 3. Cardiomegaly. 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 Chicken pox   . Depression   . Diabetes mellitus without complication (HCC)   . Heart murmur   . Hyperlipidemia   . Hypertension   . Kidney stone   . Diabetic ketoacidosis (HCC) 10/2014    Hospitalized in [place]     History reviewed. No pertinent past surgical  history.   Current Outpatient Prescriptions  Medication Sig Dispense Refill  . CINNAMON PO Take 1,000 mg by mouth daily.    . fluticasone (FLONASE) 50 MCG/ACT nasal spray Place 2 sprays into both nostrils daily as needed for allergies or rhinitis. 16 g 6  . Insulin Glargine (LANTUS SOLOSTAR) 100 UNIT/ML Solostar Pen Inject 20 Units into the skin 2 (two) times daily. 5 pen 11  . losartan (COZAAR) 50 MG tablet Take 1 tablet (50 mg total) by mouth daily. 90 tablet 1  . Multiple Vitamins-Minerals (MULTIVITAMIN WITH MINERALS) tablet Take 1 tablet by mouth daily.    . omeprazole (PRILOSEC) 20 MG capsule Take 20 mg by mouth daily as needed (for heartburn).    . simvastatin (ZOCOR) 40 MG tablet Take 40 mg by mouth daily.     No current facility-administered medications for this visit.    Allergies:   Review of patient's allergies indicates no known allergies.    Social History:  The patient  reports that he has never smoked. He does not have any smokeless tobacco history on file. He reports that he drinks alcohol.   Family History:  The patient's family history includes Diabetes in his paternal grandmother; Emphysema in his maternal grandmother; Heart attack in his paternal grandfather; Heart disease in his father; Lung cancer in his maternal aunt and maternal grandmother; Mitral valve prolapse in his father.    ROS:  Please see the history of present illness.   Otherwise, review of systems are positive for none.   All other   systems are reviewed and negative.    PHYSICAL EXAM: VS:  BP 142/86 mmHg  Pulse 85  Ht 5' 9" (1.753 m)  Wt 74.118 kg (163 lb 6.4 oz)  BMI 24.12 kg/m2  SpO2 95% , BMI Body mass index is 24.12 kg/(m^2). Affect appropriate Healthy:  appears stated age HEENT: normal Neck supple with no adenopathy JVP normal no bruits no thyromegaly Lungs clear with no wheezing and good diaphragmatic motion Heart:  S1/S2 no murmur, no rub, gallop or click PMI normal Abdomen: benighn,  BS positve, no tenderness, no AAA no bruit.  No HSM or HJR Distal pulses intact with no bruits No edema Neuro non-focal Skin warm and dry No muscular weakness    EKG:  11/02/15 junctional tachycardia RBBB rate 110  F/u SR rate 88 RBBB    Recent Labs: 11/02/2015: ALT 19; BUN 24*; Creatinine, Ser 1.30*; Hemoglobin 17.3*; Platelets 258; Potassium 3.7; Sodium 142    Lipid Panel No results found for: CHOL, TRIG, HDL, CHOLHDL, VLDL, LDLCALC, LDLDIRECT    Wt Readings from Last 3 Encounters:  11/05/15 74.118 kg (163 lb 6.4 oz)  09/02/15 75.297 kg (166 lb)  08/31/15 75.932 kg (167 lb 6.4 oz)      Other studies Reviewed: Additional studies/ records that were reviewed today include:  Epic / ER notes, CXR, ECG and labs .    ASSESSMENT AND PLAN:  1. Dyspnea etiology not clear but given IDDM needs exercise myovue and echo  2. HTN: Well controlled.  Continue current medications and low sodium Dash type diet.   3: Chol: No results found for: LDLCALC labs with primary on statin 4. DM: f/u [facility]   Discussed low carb diet.  Target hemoglobin A1c is 6.5 or less.  Continue current medications. 5: Tachycardia  Not associated with his dyspnea will offer event monitor if he has recurrent palpitations 6. Pulmonary nodule  F/u non contrast CT 6 months per primary low risk non smoker    Current medicines are reviewed at length with the patient today.  The patient does not have concerns regarding medicines.  The following changes have been made:  no change  Labs/ tests ordered today include:  Exercise myovue Echo   No orders of the defined types were placed in this encounter.     Disposition:   FU with me next available      Signed, Michiel Sivley, MD  11/05/2015 9:33 AM    [facility] [address], [place], [place]  [address] Phone: [phone]; Fax: [phone]    

## 2015-11-05 ENCOUNTER — Ambulatory Visit (INDEPENDENT_AMBULATORY_CARE_PROVIDER_SITE_OTHER): Payer: BLUE CROSS/BLUE SHIELD | Admitting: Cardiovascular Disease

## 2015-11-05 ENCOUNTER — Encounter: Payer: Self-pay | Admitting: Cardiovascular Disease

## 2015-11-05 VITALS — BP 142/86 | HR 85 | Ht 69.0 in | Wt 163.4 lb

## 2015-11-05 DIAGNOSIS — Z7189 Other specified counseling: Secondary | ICD-10-CM

## 2015-11-05 DIAGNOSIS — R0600 Dyspnea, unspecified: Secondary | ICD-10-CM

## 2015-11-05 DIAGNOSIS — Z7689 Persons encountering health services in other specified circumstances: Secondary | ICD-10-CM

## 2015-11-05 NOTE — Patient Instructions (Addendum)
Medication Instructions:  Your physician recommends that you continue on your current medications as directed. Please refer to the Current Medication list given to you today.  Labwork: NONE  Testing/Procedures: Your physician has requested that you have an echocardiogram. Echocardiography is a painless test that uses sound waves to create images of your heart. It provides your doctor with information about the size and shape of your heart and how well your heart's chambers and valves are working. This procedure takes approximately one hour. There are no restrictions for this procedure.  Your physician has requested that you have en exercise stress myoview. For further information please visit [email or web address]. Please follow instruction sheet, as given.  Follow-Up: Your physician wants you to follow-up first available with Dr. Eden EmmsNishan.  If you need a refill on your cardiac medications before your next appointment, please call your pharmacy.

## 2015-11-10 ENCOUNTER — Telehealth: Payer: Self-pay

## 2015-11-10 NOTE — Telephone Encounter (Signed)
[name] (DPR signed) said pt was seen [facility] on 11/02/15 with SOB and found nodule on lung; pt has seen Dr Eden Emms on 11/05/15 and scheduled for echo an stress test on 11/19/15. Pt has appt with Mayra Reel NP on 11/12/15. Presently pt is at work and pt is feeling nauseated,tired, heart racing and BS is going up and down, this AM BS >200. No available appts at [facility] or [facility]. Jae Dire advised if no appts available pt should go to ED for eval. [name] said OK but was not happy with [facility]; main focus was on $400.00 deductible but she would see he went to ED for eval and will be looking for another PCP and ended call.

## 2015-11-10 NOTE — Telephone Encounter (Signed)
Noted. I'm happy to see him as scheduled later this week.

## 2015-11-12 ENCOUNTER — Ambulatory Visit (INDEPENDENT_AMBULATORY_CARE_PROVIDER_SITE_OTHER): Payer: BLUE CROSS/BLUE SHIELD | Admitting: Primary Care

## 2015-11-12 VITALS — BP 128/84 | HR 87 | Temp 97.6°F | Ht 69.0 in | Wt 162.4 lb

## 2015-11-12 DIAGNOSIS — I10 Essential (primary) hypertension: Secondary | ICD-10-CM | POA: Diagnosis not present

## 2015-11-12 DIAGNOSIS — Z794 Long term (current) use of insulin: Secondary | ICD-10-CM | POA: Diagnosis not present

## 2015-11-12 DIAGNOSIS — E119 Type 2 diabetes mellitus without complications: Secondary | ICD-10-CM

## 2015-11-12 LAB — BASIC METABOLIC PANEL
BUN: 23 mg/dL (ref 6–23)
CHLORIDE: 105 meq/L (ref 96–112)
CO2: 29 meq/L (ref 19–32)
Calcium: 9.1 mg/dL (ref 8.4–10.5)
Creatinine, Ser: 1.32 mg/dL (ref 0.40–1.50)
GFR: 60.55 mL/min (ref >60.00)
GLUCOSE: 110 mg/dL — AB (ref 70–99)
POTASSIUM: 3.8 meq/L (ref 3.5–5.1)
SODIUM: 140 meq/L (ref 135–145)

## 2015-11-12 LAB — HEMOGLOBIN A1C: Hgb A1c MFr Bld: 10.3 % — ABNORMAL HIGH (ref 4.6–6.5)

## 2015-11-12 NOTE — Patient Instructions (Addendum)
Lantus: Inject 15 units in the morning and 25 units in the evening.  Work to reduce carbohydrate consumption in the form of pastas, rice, breads, sweets. Your bedtime snack should be a lower carbohydrate food.  Reduce to 55-60 carbs per main meal and 20-25 carbs per snack.  Continue to check your sugars and e-mail me in 2 weeks with readings.  Follow up in 6 months for annual physical.  It was a pleasure to see you today!  Diabetes Mellitus and Food It is important for you to manage your blood sugar (glucose) level. Your blood glucose level can be greatly affected by what you eat. Eating healthier foods in the appropriate amounts throughout the day at about the same time each day will help you control your blood glucose level. It can also help slow or prevent worsening of your diabetes mellitus. Healthy eating may even help you improve the level of your blood pressure and reach or maintain a healthy weight.  General recommendations for healthful eating and cooking habits include:  Eating meals and snacks regularly. Avoid going long periods of time without eating to lose weight.  Eating a diet that consists mainly of plant-based foods, such as fruits, vegetables, nuts, legumes, and whole grains.  Using low-heat cooking methods, such as baking, instead of high-heat cooking methods, such as deep frying. Work with your dietitian to make sure you understand how to use the Nutrition Facts information on food labels. HOW CAN FOOD AFFECT ME? Carbohydrates Carbohydrates affect your blood glucose level more than any other type of food. Your dietitian will help you determine how many carbohydrates to eat at each meal and teach you how to count carbohydrates. Counting carbohydrates is important to keep your blood glucose at a healthy level, especially if you are using insulin or taking certain medicines for diabetes mellitus. Alcohol Alcohol can cause sudden decreases in blood glucose (hypoglycemia),  especially if you use insulin or take certain medicines for diabetes mellitus. Hypoglycemia can be a life-threatening condition. Symptoms of hypoglycemia (sleepiness, dizziness, and disorientation) are similar to symptoms of having too much alcohol.  If your health care provider has given you approval to drink alcohol, do so in moderation and use the following guidelines:  Women should not have more than one drink per day, and men should not have more than two drinks per day. One drink is equal to:  12 oz of beer.  5 oz of wine.  1 oz of hard liquor.  Do not drink on an empty stomach.  Keep yourself hydrated. Have water, diet soda, or unsweetened iced tea.  Regular soda, juice, and other mixers might contain a lot of carbohydrates and should be counted. WHAT FOODS ARE NOT RECOMMENDED? As you make food choices, it is important to remember that all foods are not the same. Some foods have fewer nutrients per serving than other foods, even though they might have the same number of calories or carbohydrates. It is difficult to get your body what it needs when you eat foods with fewer nutrients. Examples of foods that you should avoid that are high in calories and carbohydrates but low in nutrients include:  Trans fats (most processed foods list trans fats on the Nutrition Facts label).  Regular soda.  Juice.  Candy.  Sweets, such as cake, pie, doughnuts, and cookies.  Fried foods. WHAT FOODS CAN I EAT? Eat nutrient-rich foods, which will nourish your body and keep you healthy. The food you should eat also will depend  on several factors, including:  The calories you need.  The medicines you take.  Your weight.  Your blood glucose level.  Your blood pressure level.  Your cholesterol level. You should eat a variety of foods, including:  Protein.  Lean cuts of meat.  Proteins low in saturated fats, such as fish, egg whites, and beans. Avoid processed meats.  Fruits and  vegetables.  Fruits and vegetables that may help control blood glucose levels, such as apples, mangoes, and yams.  Dairy products.  Choose fat-free or low-fat dairy products, such as milk, yogurt, and cheese.  Grains, bread, pasta, and rice.  Choose whole grain products, such as multigrain bread, whole oats, and brown rice. These foods may help control blood pressure.  Fats.  Foods containing healthful fats, such as nuts, avocado, olive oil, canola oil, and fish. DOES EVERYONE WITH DIABETES MELLITUS HAVE THE SAME MEAL PLAN? Because every person with diabetes mellitus is different, there is not one meal plan that works for everyone. It is very important that you meet with a dietitian who will help you create a meal plan that is just right for you.   This information is not intended to replace advice given to you by your health care provider. Make sure you discuss any questions you have with your health care provider.   Document Released: 07/07/2005 Document Revised: 10/31/2014 Document Reviewed: 09/06/2013 Elsevier Interactive Patient Education [employer].

## 2015-11-12 NOTE — Progress Notes (Signed)
Subjective:    Patient ID: Dakota Sutton, male    DOB: 1966/07/06, 50 y.o.   MRN: [id number]  HPI  Mr. Mcguire is a 50 year old male who presents today for follow up.  1) Type 2 Diabetes: Currently managed on Lanuts 20 units BID. He's checking his sugars twice daily and have been ranging: 170-190's on average. His last A1C was 6+ months ago. It was not obtained last visit as he has been out of his insulin.  His AM sugars are about 250-260's and his PM sugars before dinner are 40's-80's.  He was initially managed on 30 units BID, then 15 in the AM and 35 in the PM, then 20 in the AM and 20 in the PM.   He endorses a fair diet which consists of: Breakfast: Toaster strudle (if low sugar), eggs with potatoes Snack: Peanut butter crackers Lunch: Sandwich, chips, cookie Snack: Candy bar (if feeling weak), Nutty Buddy bar Dinner: Meat, carb, vegetable Snack: Nuts, Nutty Buddy bar  Beverages: Water, occasional coke if feeling like sugars are too low.  He was evaluated by a diabetic nutritionst in the past and was told to keep carbs to 65 grams during meals and 35 grams for snacks.  2) Essential Hypertension: Currently managed on losartan 50 mg. Denies chest pain. BP stable in clinic today.  3) Palpitations/SOB: Evaluated at [facility] on 11/02/15 for a chief complaint of SOB with sudden onset of palpitations.  ECG, labs, CT angio unremarkable. CT did notice pulmonary nodule that will require follow up.  He was referred to cardiology for further evaluation and was seen by Dr. Eden Emms last week. He is going for ECG and nuclear stress test next week for further evaluation. This has occurred several times in the past with a negative work out. Most recent re-occurrence was on January 2nd and again on the 9th. Denies SOB, palpitations, or chest pain today.  Review of Systems  Respiratory: Negative for cough and shortness of breath.   Cardiovascular: Negative for chest pain and palpitations.  Neurological:  Negative for dizziness and headaches.       Past Medical History  Diagnosis Date  . Chicken pox   . Depression   . Diabetes mellitus without complication (HCC)   . Heart murmur   . Hyperlipidemia   . Hypertension   . Kidney stone   . Diabetic ketoacidosis (HCC) 10/2014    Hospitalized in [place]     Social History   Social History  . Marital Status: Married    Spouse Name: N/A  . Number of Children: N/A  . Years of Education: N/A   Occupational History  . Not on file.   Social History Main Topics  . Smoking status: Never Smoker   . Smokeless tobacco: Not on file  . Alcohol Use: 0.0 oz/week    0 Standard drinks or equivalent per week     Comment: social  . Drug Use: Not on file  . Sexual Activity: Not on file   Other Topics Concern  . Not on file   Social History Narrative   Moved from [place].   MBA. Works as a Production designer, theatre/television/film.   1 child that lives in [place].   Enjoys bowling, softball.       No past surgical history on file.  Family History  Problem Relation Age of Onset  . Heart disease Father   . Mitral valve prolapse Father   . Lung cancer Maternal Aunt   . Emphysema Maternal  Grandmother   . Lung cancer Maternal Grandmother   . Diabetes Paternal Grandmother   . Heart attack Paternal Grandfather     No Known Allergies  Current Outpatient Prescriptions on File Prior to Visit  Medication Sig Dispense Refill  . CINNAMON PO Take 1,000 mg by mouth daily.    . fluticasone (FLONASE) 50 MCG/ACT nasal spray Place 2 sprays into both nostrils daily as needed for allergies or rhinitis. 16 g 6  . Insulin Glargine (LANTUS SOLOSTAR) 100 UNIT/ML Solostar Pen Inject 20 Units into the skin 2 (two) times daily. 5 pen 11  . losartan (COZAAR) 50 MG tablet Take 1 tablet (50 mg total) by mouth daily. 90 tablet 1  . Multiple Vitamins-Minerals (MULTIVITAMIN WITH MINERALS) tablet Take 1 tablet by mouth daily.    Marland Kitchen omeprazole (PRILOSEC) 20 MG capsule Take 20 mg by mouth daily as  needed (for heartburn).    . simvastatin (ZOCOR) 40 MG tablet Take 40 mg by mouth daily.     No current facility-administered medications on file prior to visit.    BP 128/84 mmHg  Pulse 87  Temp(Src) 97.6 F (36.4 C) (Oral)  Ht  (1.753 m)  Wt 162 lb 6.4 oz (73.664 kg)  BMI 23.97 kg/m2  SpO2 97%    Objective:   Physical Exam  Constitutional: He appears well-nourished.  Cardiovascular: Normal rate and regular rhythm.   Pulmonary/Chest: Effort normal and breath sounds normal. He has no wheezes. He has no rales.  Skin: Skin is warm and dry.  Psychiatric: He has a normal mood and affect.          Assessment & Plan:  ED Follow Up:  Presented on 11/02/15 with second occurrence of sudden palpitations with SOB, first occurrence on 10/26/15. Ct angio negative for PE. ECG and lab work unremarkable. Due for nuclear stress test next week and follow up with cardiology. Stable during examination today.

## 2015-11-12 NOTE — Assessment & Plan Note (Signed)
Stable during exam today. Continue losartan 50 mg.

## 2015-11-12 NOTE — Assessment & Plan Note (Signed)
Sugars dropping in the evening, elevated in AM. Currently managed on lantus 20 units BID. Suspect this is due to his diet as he's eating sugars and carbohydrates throughout the day. Question lantus BID dosing as this will typically last 24 hours, but he was placed on this during hospitalization. Will decrease AM dose to 15 units, increase PM dose to 25. He is to decrease junk food and carbs and monitor sugars. Will check in 2 weeks. A1C today.

## 2015-11-12 NOTE — Progress Notes (Signed)
Pre visit review using our clinic review tool, if applicable. No additional management support is needed unless otherwise documented below in the visit note. 

## 2015-11-15 ENCOUNTER — Encounter: Payer: Self-pay | Admitting: Primary Care

## 2015-11-16 ENCOUNTER — Other Ambulatory Visit: Payer: Self-pay | Admitting: Primary Care

## 2015-11-16 DIAGNOSIS — E785 Hyperlipidemia, unspecified: Secondary | ICD-10-CM

## 2015-11-16 MED ORDER — SIMVASTATIN 40 MG PO TABS: 40.0000 mg | ORAL_TABLET | Freq: Every day | ORAL | Status: DC

## 2015-11-17 ENCOUNTER — Telehealth (HOSPITAL_COMMUNITY): Payer: Self-pay | Admitting: *Deleted

## 2015-11-17 NOTE — Telephone Encounter (Signed)
Left message on voicemail per DPR in reference to upcoming appointment scheduled on 11/19/15 at 0945 with detailed instructions given per Myocardial Perfusion Study Information Sheet for the test. LM to arrive 15 minutes early, and that it is imperative to arrive on time for appointment to keep from having the test rescheduled. If you need to cancel or reschedule your appointment, please call the office within 24 hours of your appointment. Failure to do so may result in a cancellation of your appointment, and a $50 no show fee. Phone number given for call back for any questions. Mackinze Criado, Adelene Idler

## 2015-11-19 ENCOUNTER — Other Ambulatory Visit: Payer: Self-pay | Admitting: Primary Care

## 2015-11-19 ENCOUNTER — Encounter: Payer: Self-pay | Admitting: Primary Care

## 2015-11-19 ENCOUNTER — Ambulatory Visit (HOSPITAL_BASED_OUTPATIENT_CLINIC_OR_DEPARTMENT_OTHER): Payer: BLUE CROSS/BLUE SHIELD

## 2015-11-19 ENCOUNTER — Other Ambulatory Visit: Payer: Self-pay

## 2015-11-19 ENCOUNTER — Ambulatory Visit (HOSPITAL_COMMUNITY): Payer: BLUE CROSS/BLUE SHIELD | Attending: Cardiovascular Disease

## 2015-11-19 DIAGNOSIS — I10 Essential (primary) hypertension: Secondary | ICD-10-CM | POA: Diagnosis not present

## 2015-11-19 DIAGNOSIS — E1165 Type 2 diabetes mellitus with hyperglycemia: Secondary | ICD-10-CM

## 2015-11-19 DIAGNOSIS — R0602 Shortness of breath: Secondary | ICD-10-CM | POA: Insufficient documentation

## 2015-11-19 DIAGNOSIS — R0600 Dyspnea, unspecified: Secondary | ICD-10-CM | POA: Insufficient documentation

## 2015-11-19 DIAGNOSIS — E119 Type 2 diabetes mellitus without complications: Secondary | ICD-10-CM | POA: Insufficient documentation

## 2015-11-19 DIAGNOSIS — I4510 Unspecified right bundle-branch block: Secondary | ICD-10-CM | POA: Diagnosis not present

## 2015-11-19 DIAGNOSIS — R9439 Abnormal result of other cardiovascular function study: Secondary | ICD-10-CM | POA: Insufficient documentation

## 2015-11-19 DIAGNOSIS — Z794 Long term (current) use of insulin: Secondary | ICD-10-CM

## 2015-11-19 DIAGNOSIS — E785 Hyperlipidemia, unspecified: Secondary | ICD-10-CM | POA: Diagnosis not present

## 2015-11-19 LAB — MYOCARDIAL PERFUSION IMAGING
CHL CUP MPHR: 169 {beats}/min
CHL RATE OF PERCEIVED EXERTION: 18
CSEPEW: 10.1 METS
CSEPHR: 86 %
CSEPPHR: 146 {beats}/min
Exercise duration (min): 9 min
Exercise duration (sec): 0 s
LHR: 0.34
LVDIAVOL: 100 mL
LVSYSVOL: 40 mL
NUC STRESS TID: 1.03
Rest HR: 75 {beats}/min
SDS: 0
SRS: 0
SSS: 0

## 2015-11-19 MED ORDER — TECHNETIUM TC 99M SESTAMIBI GENERIC - CARDIOLITE
10.5000 | Freq: Once | INTRAVENOUS | Status: AC | PRN
  Administered 2015-11-19: 11 via INTRAVENOUS

## 2015-11-19 MED ORDER — GLUCOSE BLOOD VI STRP: ORAL_STRIP | Status: DC

## 2015-11-19 MED ORDER — TECHNETIUM TC 99M SESTAMIBI GENERIC - CARDIOLITE
31.7000 | Freq: Once | INTRAVENOUS | Status: AC | PRN
Start: 2015-11-19 — End: 2015-11-19
  Administered 2015-11-19: 31.7 via INTRAVENOUS

## 2015-11-20 ENCOUNTER — Telehealth: Payer: Self-pay

## 2015-11-20 DIAGNOSIS — Z01812 Encounter for preprocedural laboratory examination: Secondary | ICD-10-CM

## 2015-11-20 NOTE — Telephone Encounter (Signed)
-----   Message from Wendall Stade, MD sent at 11/20/2015  3:38 PM EST ----- Wendsday 2/1 can schedule first case 8:00 am if possible or latter in day I think I have TEE;s scheduled at 10:00 and 11:00 already

## 2015-11-20 NOTE — Telephone Encounter (Signed)
Patient is scheduled for Wednesday 2/1 at 8:30 for heart cath, which is the first case. Patient is coming in on Monday for labs and will pick up instructions for cath at that time. Will leave at front desk.

## 2015-11-23 ENCOUNTER — Other Ambulatory Visit (INDEPENDENT_AMBULATORY_CARE_PROVIDER_SITE_OTHER): Payer: BLUE CROSS/BLUE SHIELD | Admitting: *Deleted

## 2015-11-23 DIAGNOSIS — Z01812 Encounter for preprocedural laboratory examination: Secondary | ICD-10-CM | POA: Diagnosis not present

## 2015-11-23 LAB — BASIC METABOLIC PANEL
BUN: 24 mg/dL (ref 7–25)
CALCIUM: 8.6 mg/dL (ref 8.6–10.3)
CHLORIDE: 100 mmol/L (ref 98–110)
CO2: 23 mmol/L (ref 20–31)
CREATININE: 1.27 mg/dL (ref 0.70–1.33)
Glucose, Bld: 266 mg/dL — ABNORMAL HIGH (ref 65–99)
Potassium: 4 mmol/L (ref 3.5–5.3)
Sodium: 136 mmol/L (ref 135–146)

## 2015-11-23 LAB — CBC WITH DIFFERENTIAL/PLATELET
BASOS ABS: 0 10*3/uL (ref 0.0–0.1)
BASOS PCT: 0 % (ref 0–1)
EOS ABS: 0.2 10*3/uL (ref 0.0–0.7)
EOS PCT: 3 % (ref 0–5)
HCT: 45.4 % (ref 39.0–52.0)
Hemoglobin: 15.6 g/dL (ref 13.0–17.0)
LYMPHS ABS: 1.9 10*3/uL (ref 0.7–4.0)
Lymphocytes Relative: 35 % (ref 12–46)
MCH: 32 pg (ref 26.0–34.0)
MCHC: 34.4 g/dL (ref 30.0–36.0)
MCV: 93.2 fL (ref 78.0–100.0)
MPV: 9.4 fL (ref 8.6–12.4)
Monocytes Absolute: 0.3 10*3/uL (ref 0.1–1.0)
Monocytes Relative: 6 % (ref 3–12)
Neutro Abs: 3 10*3/uL (ref 1.7–7.7)
Neutrophils Relative %: 56 % (ref 43–77)
PLATELETS: 297 10*3/uL (ref 150–400)
RBC: 4.87 MIL/uL (ref 4.22–5.81)
RDW: 13.6 % (ref 11.5–15.5)
WBC: 5.3 10*3/uL (ref 4.0–10.5)

## 2015-11-23 LAB — PROTIME-INR
INR: 0.91 (ref <1.50)
PROTHROMBIN TIME: 12.4 s (ref 11.6–15.2)

## 2015-11-24 NOTE — H&P (View-Only) (Signed)
Patient ID: Dakota Sutton, male   DOB: 07-02-1966, 50 y.o.   MRN: [id number]     Cardiology Office Note   Date:  11/05/2015   ID:  Dakota Sutton, DOB 01-Apr-1966, MRN [id number]  PCP:  Morrie Sheldon, NP  Cardiologist:   Charlton Haws, MD   Chief Complaint  Patient presents with  . Establish Care    ESP for SOB      History of Present Illness: Daune Sutton is a 50 y.o.  male with a history of hypertension, hyperlipidemia, diabetes seen in ER 11/02/15 evaluation of short of breath. Patient reports that approximately AM of ER visit  while getting out of the shower he suddenly felt like "I ran a marathon" and reports sudden shortness of breath. He had associated nausea, diaphoresis. No syncope. Denies any chest pain, vomiting, cough, hemoptysis, leg pain or swelling, numbness or weakness, vision changes, headache. He reports a similar episode happened last week, lasted approximately 20 minutes. Patient is still currently short of breath in the emergency department. He does report flying to [place] on December 20, flying back on December 26.  Nonsmoker. Reports father had a history of mitral valve prolapse, but no other first degree cardiopulmonary problems.  Reviewed w/u:  CXR: NAD CTA:  IMPRESSION: 1. No evidence of acute pulmonary embolus. 2. Mild nonspecific lower lobe and depending ground-glass opacity, might simply reflect atelectasis. Otherwise consider viral/atypical infectious alveolitis. No pleural effusion. 3. Cardiomegaly. 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 Chicken pox   . Depression   . Diabetes mellitus without complication (HCC)   . Heart murmur   . Hyperlipidemia   . Hypertension   . Kidney stone   . Diabetic ketoacidosis (HCC) 10/2014    Hospitalized in [place]     History reviewed. No pertinent past surgical  history.   Current Outpatient Prescriptions  Medication Sig Dispense Refill  . CINNAMON PO Take 1,000 mg by mouth daily.    . fluticasone (FLONASE) 50 MCG/ACT nasal spray Place 2 sprays into both nostrils daily as needed for allergies or rhinitis. 16 g 6  . Insulin Glargine (LANTUS SOLOSTAR) 100 UNIT/ML Solostar Pen Inject 20 Units into the skin 2 (two) times daily. 5 pen 11  . losartan (COZAAR) 50 MG tablet Take 1 tablet (50 mg total) by mouth daily. 90 tablet 1  . Multiple Vitamins-Minerals (MULTIVITAMIN WITH MINERALS) tablet Take 1 tablet by mouth daily.    Marland Kitchen omeprazole (PRILOSEC) 20 MG capsule Take 20 mg by mouth daily as needed (for heartburn).    . simvastatin (ZOCOR) 40 MG tablet Take 40 mg by mouth daily.     No current facility-administered medications for this visit.    Allergies:   Review of patient's allergies indicates no known allergies.    Social History:  The patient  reports that he has never smoked. He does not have any smokeless tobacco history on file. He reports that he drinks alcohol.   Family History:  The patient's family history includes Diabetes in his paternal grandmother; Emphysema in his maternal grandmother; Heart attack in his paternal grandfather; Heart disease in his father; Lung cancer in his maternal aunt and maternal grandmother; Mitral valve prolapse in his father.    ROS:  Please see the history of present illness.   Otherwise, review of systems are positive for none.   All other  systems are reviewed and negative.    PHYSICAL EXAM: VS:  BP 142/86 mmHg  Pulse 85  Ht  (1.753 m)  Wt 74.118 kg (163 lb 6.4 oz)  BMI 24.12 kg/m2  SpO2 95% , BMI Body mass index is 24.12 kg/(m^2). Affect appropriate Healthy:  appears stated age HEENT: normal Neck supple with no adenopathy JVP normal no bruits no thyromegaly Lungs clear with no wheezing and good diaphragmatic motion Heart:  S1/S2 no murmur, no rub, gallop or click PMI normal Abdomen: benighn,  BS positve, no tenderness, no AAA no bruit.  No HSM or HJR Distal pulses intact with no bruits No edema Neuro non-focal Skin warm and dry No muscular weakness    EKG:  11/02/15 junctional tachycardia RBBB rate 110  F/u SR rate 88 RBBB    Recent Labs: 11/02/2015: ALT 19; BUN 24*; Creatinine, Ser 1.30*; Hemoglobin 17.3*; Platelets 258; Potassium 3.7; Sodium 142    Lipid Panel No results found for: CHOL, TRIG, HDL, CHOLHDL, VLDL, LDLCALC, LDLDIRECT    Wt Readings from Last 3 Encounters:  11/05/15 74.118 kg (163 lb 6.4 oz)  09/02/15 75.297 kg (166 lb)  08/31/15 75.932 kg (167 lb 6.4 oz)      Other studies Reviewed: Additional studies/ records that were reviewed today include:  Epic / ER notes, CXR, ECG and labs .    ASSESSMENT AND PLAN:  1. Dyspnea etiology not clear but given IDDM needs exercise myovue and echo  2. HTN: Well controlled.  Continue current medications and low sodium Dash type diet.   3: Chol: No results found for: [facility] labs with primary on statin 4. DM: f/u [place]   Discussed low carb diet.  Target hemoglobin A1c is 6.5 or less.  Continue current medications. 5: Tachycardia  Not associated with his dyspnea will offer event monitor if he has recurrent palpitations 6. Pulmonary nodule  F/u non contrast CT 6 months per primary low risk non smoker    Current medicines are reviewed at length with the patient today.  The patient does not have concerns regarding medicines.  The following changes have been made:  no change  Labs/ tests ordered today include:  Exercise myovue Echo   No orders of the defined types were placed in this encounter.     Disposition:   FU with me next available      Signed, Charlton Haws, MD  11/05/2015 9:33 AM    [facility] [address] [place], [place], [place]  [address] Phone: [phone]; Fax: [phone]

## 2015-11-24 NOTE — Interval H&P Note (Signed)
History and Physical Interval Note:  11/24/2015 4:50 PM  Dakota Sutton  has presented today for surgery, with the diagnosis of abnormal myoview  The various methods of treatment have been discussed with the patient and family. After consideration of risks, benefits and other options for treatment, the patient has consented to  Procedure(s): Left Heart Cath and Coronary Angiography (N/A) as a surgical intervention .  The patient's history has been reviewed, patient examined, no change in status, stable for surgery.  I have reviewed the patient's chart and labs.  Questions were answered to the patient's satisfaction.     Charlton Haws

## 2015-11-25 ENCOUNTER — Encounter (HOSPITAL_COMMUNITY)
Admission: RE | Disposition: A | Discharge status: Home / Self Care | Payer: Self-pay | Source: Ambulatory Visit | Attending: Cardiovascular Disease

## 2015-11-25 ENCOUNTER — Ambulatory Visit (HOSPITAL_COMMUNITY)
Admission: RE | Admit: 2015-11-25 | Discharge: 2015-11-25 | Disposition: A | Discharge status: Home / Self Care | Payer: BLUE CROSS/BLUE SHIELD | Source: Ambulatory Visit | Attending: Cardiovascular Disease | Admitting: Cardiovascular Disease

## 2015-11-25 ENCOUNTER — Encounter (HOSPITAL_COMMUNITY): Payer: Self-pay | Admitting: Cardiology

## 2015-11-25 DIAGNOSIS — R079 Chest pain, unspecified: Secondary | ICD-10-CM | POA: Diagnosis not present

## 2015-11-25 DIAGNOSIS — I517 Cardiomegaly: Secondary | ICD-10-CM | POA: Diagnosis not present

## 2015-11-25 DIAGNOSIS — I10 Essential (primary) hypertension: Secondary | ICD-10-CM | POA: Diagnosis not present

## 2015-11-25 DIAGNOSIS — R011 Cardiac murmur, unspecified: Secondary | ICD-10-CM | POA: Diagnosis not present

## 2015-11-25 DIAGNOSIS — R0602 Shortness of breath: Secondary | ICD-10-CM | POA: Insufficient documentation

## 2015-11-25 DIAGNOSIS — I2510 Atherosclerotic heart disease of native coronary artery without angina pectoris: Secondary | ICD-10-CM | POA: Diagnosis not present

## 2015-11-25 DIAGNOSIS — R000 Tachycardia, unspecified: Secondary | ICD-10-CM | POA: Insufficient documentation

## 2015-11-25 DIAGNOSIS — Z87442 Personal history of urinary calculi: Secondary | ICD-10-CM | POA: Diagnosis not present

## 2015-11-25 DIAGNOSIS — R918 Other nonspecific abnormal finding of lung field: Secondary | ICD-10-CM | POA: Insufficient documentation

## 2015-11-25 DIAGNOSIS — Z794 Long term (current) use of insulin: Secondary | ICD-10-CM | POA: Diagnosis not present

## 2015-11-25 DIAGNOSIS — E785 Hyperlipidemia, unspecified: Secondary | ICD-10-CM | POA: Diagnosis not present

## 2015-11-25 DIAGNOSIS — E119 Type 2 diabetes mellitus without complications: Secondary | ICD-10-CM | POA: Diagnosis not present

## 2015-11-25 DIAGNOSIS — Z8249 Family history of ischemic heart disease and other diseases of the circulatory system: Secondary | ICD-10-CM | POA: Insufficient documentation

## 2015-11-25 DIAGNOSIS — F329 Major depressive disorder, single episode, unspecified: Secondary | ICD-10-CM | POA: Diagnosis not present

## 2015-11-25 DIAGNOSIS — R0600 Dyspnea, unspecified: Secondary | ICD-10-CM | POA: Diagnosis not present

## 2015-11-25 HISTORY — PX: CARDIAC CATHETERIZATION: SHX172

## 2015-11-25 LAB — GLUCOSE, CAPILLARY
Glucose-Capillary: 196 mg/dL — ABNORMAL HIGH (ref 65–99)
Glucose-Capillary: 142 mg/dL — ABNORMAL HIGH (ref 65–99)

## 2015-11-25 SURGERY — LEFT HEART CATH AND CORONARY ANGIOGRAPHY
Anesthesia: LOCAL

## 2015-11-25 MED ORDER — SODIUM CHLORIDE 0.9 % IV SOLN: 250.0000 mL | INTRAVENOUS | Status: DC | PRN

## 2015-11-25 MED ORDER — SODIUM CHLORIDE 0.9 % WEIGHT BASED INFUSION
3.0000 mL/kg/h | INTRAVENOUS | Status: AC
  Administered 2015-11-25: 3 mL/kg/h via INTRAVENOUS

## 2015-11-25 MED ORDER — ONDANSETRON HCL 4 MG/2ML IJ SOLN: 4.0000 mg | Freq: Four times a day (QID) | INTRAMUSCULAR | Status: DC | PRN

## 2015-11-25 MED ORDER — VERAPAMIL HCL 2.5 MG/ML IV SOLN
INTRAVENOUS | Status: AC
  Filled 2015-11-25: qty 2

## 2015-11-25 MED ORDER — MIDAZOLAM HCL 2 MG/2ML IJ SOLN
INTRAMUSCULAR | Status: AC
Start: 2015-11-25 — End: 2015-11-25
  Filled 2015-11-25: qty 2

## 2015-11-25 MED ORDER — IOHEXOL 350 MG/ML SOLN
INTRAVENOUS | Status: DC | PRN
  Administered 2015-11-25: 75 mL via INTRA_ARTERIAL

## 2015-11-25 MED ORDER — SODIUM CHLORIDE 0.9 % WEIGHT BASED INFUSION: 1.0000 mL/kg/h | INTRAVENOUS | Status: DC

## 2015-11-25 MED ORDER — LIDOCAINE HCL (PF) 1 % IJ SOLN
INTRAMUSCULAR | Status: DC | PRN
  Administered 2015-11-25: 5 mL

## 2015-11-25 MED ORDER — SODIUM CHLORIDE 0.9% FLUSH: 3.0000 mL | Freq: Two times a day (BID) | INTRAVENOUS | Status: DC

## 2015-11-25 MED ORDER — VERAPAMIL HCL 2.5 MG/ML IV SOLN
INTRAVENOUS | Status: DC | PRN
  Administered 2015-11-25: 10 mL via INTRA_ARTERIAL

## 2015-11-25 MED ORDER — MIDAZOLAM HCL 2 MG/2ML IJ SOLN
INTRAMUSCULAR | Status: DC | PRN
  Administered 2015-11-25: 1 mg via INTRAVENOUS

## 2015-11-25 MED ORDER — SODIUM CHLORIDE 0.9% FLUSH: 3.0000 mL | INTRAVENOUS | Status: DC | PRN

## 2015-11-25 MED ORDER — ACETAMINOPHEN 325 MG PO TABS: 650.0000 mg | ORAL_TABLET | ORAL | Status: DC | PRN

## 2015-11-25 MED ORDER — FENTANYL CITRATE (PF) 100 MCG/2ML IJ SOLN
INTRAMUSCULAR | Status: DC | PRN
  Administered 2015-11-25: 25 ug via INTRAVENOUS

## 2015-11-25 MED ORDER — SODIUM CHLORIDE 0.9 % WEIGHT BASED INFUSION: 3.0000 mL/kg/h | INTRAVENOUS | Status: DC

## 2015-11-25 MED ORDER — ASPIRIN 81 MG PO CHEW
CHEWABLE_TABLET | ORAL | Status: AC
  Filled 2015-11-25: qty 1

## 2015-11-25 MED ORDER — HEPARIN SODIUM (PORCINE) 1000 UNIT/ML IJ SOLN
INTRAMUSCULAR | Status: DC | PRN
  Administered 2015-11-25: 3500 [IU] via INTRAVENOUS

## 2015-11-25 MED ORDER — HEPARIN SODIUM (PORCINE) 1000 UNIT/ML IJ SOLN
INTRAMUSCULAR | Status: AC
  Filled 2015-11-25: qty 1

## 2015-11-25 MED ORDER — ASPIRIN 81 MG PO CHEW
81.0000 mg | CHEWABLE_TABLET | ORAL | Status: AC
Start: 2015-11-26 — End: 2015-11-25
  Administered 2015-11-25: 81 mg via ORAL

## 2015-11-25 MED ORDER — FENTANYL CITRATE (PF) 100 MCG/2ML IJ SOLN
INTRAMUSCULAR | Status: AC
  Filled 2015-11-25: qty 2

## 2015-11-25 MED ORDER — HEPARIN (PORCINE) IN NACL 2-0.9 UNIT/ML-% IJ SOLN
INTRAMUSCULAR | Status: AC
Start: 2015-11-25 — End: 2015-11-25
  Filled 2015-11-25: qty 1000

## 2015-11-25 SURGICAL SUPPLY — 11 items

## 2015-11-25 NOTE — Interval H&P Note (Signed)
Cath Lab Visit (complete for each Cath Lab visit)  Clinical Evaluation Leading to the Procedure:   ACS: No.  Non-ACS:    Anginal Classification: CCS III  Anti-ischemic medical therapy: Minimal Therapy (1 class of medications)  Non-Invasive Test Results: Intermediate-risk stress test findings: cardiac mortality 1-3%/year  Prior CABG: No previous CABG      History and Physical Interval Note:  11/25/2015 9:15 AM  Dakota Sutton  has presented today for surgery, with the diagnosis of abnormal myoview  The various methods of treatment have been discussed with the patient and family. After consideration of risks, benefits and other options for treatment, the patient has consented to  Procedure(s): Left Heart Cath and Coronary Angiography (N/A) as a surgical intervention .  The patient's history has been reviewed, patient examined, no change in status, stable for surgery.  I have reviewed the patient's chart and labs.  Questions were answered to the patient's satisfaction.     Dakota Sutton [employer]

## 2015-11-25 NOTE — Discharge Instructions (Signed)
Radial Site Care °Refer to this sheet in the next few weeks. These instructions provide you with information about caring for yourself after your procedure. Your health care provider may also give you more specific instructions. Your treatment has been planned according to current medical practices, but problems sometimes occur. Call your health care provider if you have any problems or questions after your procedure. °WHAT TO EXPECT AFTER THE PROCEDURE °After your procedure, it is typical to have the following: °· Bruising at the radial site that usually fades within 1-2 weeks. °· Blood collecting in the tissue (hematoma) that may be painful to the touch. It should usually decrease in size and tenderness within 1-2 weeks. °HOME CARE INSTRUCTIONS °· Take medicines only as directed by your health care provider. °· You may shower 24-48 hours after the procedure or as directed by your health care provider. Remove the bandage (dressing) and gently wash the site with plain soap and water. Pat the area dry with a clean towel. Do not rub the site, because this may cause bleeding. °· Do not take baths, swim, or use a hot tub until your health care provider approves. °· Check your insertion site every day for redness, swelling, or drainage. °· Do not apply powder or lotion to the site. °· Do not flex or bend the affected arm for 24 hours or as directed by your health care provider. °· Do not push or pull heavy objects with the affected arm for 24 hours or as directed by your health care provider. °· Do not lift over 10 lb (4.5 kg) for 5 days after your procedure or as directed by your health care provider. °· Ask your health care provider when it is okay to: °¨ Return to work or school. °¨ Resume usual physical activities or sports. °¨ Resume sexual activity. °· Do not drive home if you are discharged the same day as the procedure. Have someone else drive you. °· You may drive 24 hours after the procedure unless otherwise  instructed by your health care provider. °· Do not operate machinery or power tools for 24 hours after the procedure. °· If your procedure was done as an outpatient procedure, which means that you went home the same day as your procedure, a responsible adult should be with you for the first 24 hours after you arrive home. °· Keep all follow-up visits as directed by your health care provider. This is important. °SEEK MEDICAL CARE IF: °· You have a fever. °· You have chills. °· You have increased bleeding from the radial site. Hold pressure on the site. °SEEK IMMEDIATE MEDICAL CARE IF: °· You have unusual pain at the radial site. °· You have redness, warmth, or swelling at the radial site. °· You have drainage (other than a small amount of blood on the dressing) from the radial site. °· The radial site is bleeding, and the bleeding does not stop after 30 minutes of holding steady pressure on the site. °· Your arm or hand becomes pale, cool, tingly, or numb. °  °This information is not intended to replace advice given to you by your health care provider. Make sure you discuss any questions you have with your health care provider. °  °Document Released: 11/12/2010 Document Revised: 10/31/2014 Document Reviewed: 04/28/2014 °Elsevier Interactive Patient Education ©2016 [employer] ° °

## 2015-11-25 NOTE — Research (Signed)
CADLAD Informed Consent   Subject Name: Dakota Sutton  Subject met inclusion and exclusion criteria.  The informed consent form, study requirements and expectations were reviewed with the subject and questions and concerns were addressed prior to the signing of the consent form.  The subject verbalized understanding of the trail requirements.  The subject agreed to participate in the CADLAD trial and signed the informed consent.  The informed consent was obtained prior to performance of any protocol-specific procedures for the subject.  A copy of the signed informed consent was given to the subject and a copy was placed in the subject's medical record.  Sandie Ano 11/25/2015, 7:25

## 2015-11-26 ENCOUNTER — Encounter: Payer: Self-pay | Admitting: Primary Care

## 2015-11-26 ENCOUNTER — Encounter: Payer: Self-pay | Admitting: Cardiovascular Disease

## 2015-11-26 DIAGNOSIS — E119 Type 2 diabetes mellitus without complications: Secondary | ICD-10-CM

## 2015-11-26 DIAGNOSIS — Z794 Long term (current) use of insulin: Secondary | ICD-10-CM

## 2015-11-26 NOTE — Telephone Encounter (Signed)
Put referral in for Dr. Elvera Lennox for DM. Sent contact number to patient. Will send message to [facility] for scheduling.

## 2015-11-28 LAB — HM DIABETES EYE EXAM

## 2015-12-01 ENCOUNTER — Encounter: Payer: Self-pay | Admitting: Cardiovascular Disease

## 2015-12-01 NOTE — Progress Notes (Signed)
Patient ID: Dakota Sutton, male   DOB: July 19, 1966, 51 y.o.   MRN: [id number]     Cardiology Office Note   Date:  12/03/2015   ID:  Dakota Sutton, DOB Aug 04, 1966, MRN [id number]  PCP:  Morrie Sheldon, NP  Cardiologist:   Charlton Haws, MD   Chief Complaint  Patient presents with  . Follow-up    hx of SOB      History of Present Illness: Dakota Sutton is a 50 y.o.  male with a history of hypertension, hyperlipidemia, diabetes seen in ER 11/02/15 evaluation of short of breath. Patient reports that approximately AM of ER visit  while getting out of the shower he suddenly felt like "I ran a marathon" and reports sudden shortness of breath. He had associated nausea, diaphoresis. No syncope. Denies any chest pain, vomiting, cough, hemoptysis, leg pain or swelling, numbness or weakness, vision changes, headache. He reports a similar episode happened last week, lasted approximately 20 minutes. Patient is still currently short of breath in the emergency department. He does report flying to [place] on December 20, flying back on December 26.  Nonsmoker. Reports father had a history of mitral valve prolapse, but no other first degree cardiopulmonary problems.  Reviewed w/u:  CXR: NAD CTA:  IMPRESSION: 1. No evidence of acute pulmonary embolus. 2. Mild nonspecific lower lobe and depending ground-glass opacity, might simply reflect atelectasis. Otherwise consider viral/atypical infectious alveolitis. No pleural effusion. 3. Cardiomegaly. 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  There was no ST segment deviation noted during stress.  No T wave inversion was noted during stress.  This is a low risk study.  The left ventricular ejection fraction is normal (55-65%).  Defect 1: There is a small defect of moderate severity present in the  basal inferior and mid inferior location.  There is reduced tracer uptake in all distributions, most prominent in the inferior wall. Given the normal wall motion, normal systolic function, normal TID ratio and 10 METs on exercise treadmill, suspect this is artifact due to extracardiac activity rather than diffuse ischemia. Suspect inferior wall is related to diaphragm attenuation.  Cath 11/25/15 showed no significant CAD just 70% ostial septal perforator disease with normal EF and filling pressures Done by Dr Shirlee Latch from right radial despite anomalous right subclavian   Past Medical History  Diagnosis Date  . Chicken pox   . Depression   . Diabetes mellitus without complication (HCC)   . Heart murmur   . Hyperlipidemia   . Hypertension   . Kidney stone   . Diabetic ketoacidosis (HCC) 10/2014    Hospitalized in [place]     Past Surgical History  Procedure Laterality Date  . Cardiac catheterization N/A 11/25/2015    Procedure: Left Heart Cath and Coronary Angiography;  Surgeon: Laurey Morale, MD;  Location: [facility] INVASIVE CV LAB;  Service: Cardiovascular;  Laterality: N/A;     Current Outpatient Prescriptions  Medication Sig Dispense Refill  . CINNAMON PO Take 1,000 mg by mouth daily.    . fluticasone (FLONASE) 50 MCG/ACT nasal spray Place 2 sprays into both nostrils daily as needed for allergies or rhinitis. 16 g 6  . insulin glargine (LANTUS) 100 UNIT/ML injection Inject 15-25 Units into the skin as directed.    Marland Kitchen losartan (COZAAR) 50 MG tablet Take 1 tablet (50 mg total) by  mouth daily. 90 tablet 1  . Multiple Vitamins-Minerals (MULTIVITAMIN WITH MINERALS) tablet Take 1 tablet by mouth daily.    Marland Kitchen omeprazole (PRILOSEC) 20 MG capsule Take 20 mg by mouth daily as needed (for heartburn).    . simvastatin (ZOCOR) 40 MG tablet Take 1 tablet (40 mg total) by mouth daily. 90 tablet 2   No current facility-administered medications for this visit.    Allergies:   Review of patient's  allergies indicates no known allergies.    Social History:  The patient  reports that he has never smoked. He does not have any smokeless tobacco history on file. He reports that he drinks alcohol.   Family History:  The patient's family history includes Diabetes in his paternal grandmother; Emphysema in his maternal grandmother; Heart attack in his paternal grandfather; Heart disease in his father; Lung cancer in his maternal aunt and maternal grandmother; Mitral valve prolapse in his father.    ROS:  Please see the history of present illness.   Otherwise, review of systems are positive for none.   All other systems are reviewed and negative.    PHYSICAL EXAM: VS:  BP 140/94 mmHg  Pulse 76  Ht  (1.753 m)  Wt 74.39 kg (164 lb)  BMI 24.21 kg/m2  SpO2 99% , BMI Body mass index is 24.21 kg/(m^2). Affect appropriate Healthy:  appears stated age HEENT: normal Neck supple with no adenopathy JVP normal no bruits no thyromegaly Lungs clear with no wheezing and good diaphragmatic motion Heart:  S1/S2 no murmur, no rub, gallop or click PMI normal Abdomen: benighn, BS positve, no tenderness, no AAA no bruit.  No HSM or HJR Distal pulses intact with no bruits No edema Neuro non-focal Skin warm and dry No muscular weakness    EKG:  11/02/15 junctional tachycardia RBBB rate 110   11/2015  F/u SR rate 88 RBBB    Recent Labs: 11/02/2015: ALT 19 11/23/2015: BUN 24; Creat 1.27; Hemoglobin 15.6; Platelets 297; Potassium 4.0; Sodium 136    Lipid Panel No results found for: CHOL, TRIG, HDL, CHOLHDL, VLDL, LDLCALC, LDLDIRECT    Wt Readings from Last 3 Encounters:  12/03/15 74.39 kg (164 lb)  11/25/15 73.936 kg (163 lb)  11/19/15 73.936 kg (163 lb)      Other studies Reviewed: Additional studies/ records that were reviewed today include:  Epic / ER notes, CXR, ECG and labs .    ASSESSMENT AND PLAN:  1. Dyspnea etiology not clear EF normal and no CAD 2. HTN: Well controlled.   Continue current medications and low sodium Dash type diet.   3: Chol: No results found for: [facility] labs with primary on statin 4. DM: f/u [place]   Discussed low carb diet.  Target hemoglobin A1c is 6.5 or less.  Continue current medications. 5: Tachycardia  Not associated with his dyspnea will offer event monitor if he has recurrent palpitations 6. Pulmonary nodule  F/u non contrast CT 6 months per primary low risk non smoker    Current medicines are reviewed at length with the patient today.  The patient does not have concerns regarding medicines.  The following changes have been made:  no change  Refer [facility] for primary care  Labs/ tests ordered today include:    No orders of the defined types were placed in this encounter.     Disposition:   FU with me one year     Signed, Charlton Haws, MD  12/03/2015 8:33 AM  [facility] [address], [place], [place]  [address] Phone: [phone]; Fax: [phone]

## 2015-12-03 ENCOUNTER — Ambulatory Visit (INDEPENDENT_AMBULATORY_CARE_PROVIDER_SITE_OTHER): Payer: BLUE CROSS/BLUE SHIELD | Admitting: Cardiovascular Disease

## 2015-12-03 ENCOUNTER — Encounter: Payer: Self-pay | Admitting: Cardiovascular Disease

## 2015-12-03 VITALS — BP 140/94 | HR 76 | Ht 69.0 in | Wt 164.0 lb

## 2015-12-03 DIAGNOSIS — Z09 Encounter for follow-up examination after completed treatment for conditions other than malignant neoplasm: Secondary | ICD-10-CM | POA: Diagnosis not present

## 2015-12-03 NOTE — Patient Instructions (Signed)
Medication Instructions:  Your physician recommends that you continue on your current medications as directed. Please refer to the Current Medication list given to you today.  Labwork: NONE  Testing/Procedures: NONE  Follow-Up: Your physician wants you to follow-up in: 12 months with Dr. Eden Emms. You will receive a reminder letter in the mail two months in advance. If you don't receive a letter, please call our office to schedule the follow-up appointment.  If you need a refill on your cardiac medications before your next appointment, please call your pharmacy.  Your physician recommends that you see a PCP:  Dr. Hillard Danker - [phone] Dr. Alysia Penna  [phone]

## 2015-12-07 ENCOUNTER — Other Ambulatory Visit: Payer: Self-pay | Admitting: Primary Care

## 2015-12-07 DIAGNOSIS — K219 Gastro-esophageal reflux disease without esophagitis: Secondary | ICD-10-CM

## 2015-12-07 NOTE — Telephone Encounter (Signed)
Electronically refill request for   omeprazole (PRILOSEC) 20 MG capsule   Take 1 capsule (20 mg total) by mouth daily.  Dispense: 30 capsule   Refills: 0     Last prescribed on 10/23/2015. Last seen on 11/12/2015. CPE on 05/11/2016.

## 2015-12-16 ENCOUNTER — Encounter: Payer: Self-pay | Admitting: Internal Medicine

## 2015-12-16 ENCOUNTER — Ambulatory Visit (INDEPENDENT_AMBULATORY_CARE_PROVIDER_SITE_OTHER): Payer: BLUE CROSS/BLUE SHIELD | Admitting: Internal Medicine

## 2015-12-16 VITALS — BP 122/80 | HR 87 | Temp 97.9°F | Resp 12 | Ht 69.0 in | Wt 163.0 lb

## 2015-12-16 DIAGNOSIS — E1022 Type 1 diabetes mellitus with diabetic chronic kidney disease: Secondary | ICD-10-CM

## 2015-12-16 DIAGNOSIS — N182 Chronic kidney disease, stage 2 (mild): Secondary | ICD-10-CM

## 2015-12-16 DIAGNOSIS — E1065 Type 1 diabetes mellitus with hyperglycemia: Secondary | ICD-10-CM | POA: Diagnosis not present

## 2015-12-16 DIAGNOSIS — IMO0002 Reserved for concepts with insufficient information to code with codable children: Secondary | ICD-10-CM

## 2015-12-16 MED ORDER — INSULIN ASPART 100 UNIT/ML FLEXPEN: 4.0000 [IU] | PEN_INJECTOR | Freq: Three times a day (TID) | SUBCUTANEOUS | Status: DC

## 2015-12-16 MED ORDER — GLUCAGON (RDNA) 1 MG IJ KIT: 1.0000 mg | PACK | Freq: Once | INTRAMUSCULAR | Status: AC | PRN

## 2015-12-16 NOTE — Progress Notes (Signed)
Patient ID: Dakota Sutton, male   DOB: [birth year], 50 y.o.   MRN: [id number]  HPI: Dakota Sutton is a 50 y.o.-year-old male, referred by his cardiologist, Dr Eden Emms, for management of DM, dx in 2002, insulin-dependent since 2006, uncontrolled, with complications (hypoglycemia). He is here with his fiancee who offers part of the hx. He moved to [facility] from [place] in July 2016.  Last hemoglobin A1c was: Lab Results  Component Value Date   HGBA1C 10.3* 11/12/2015   Pt is on a regimen of: - Lantus 15 in am and 25 units at bedtime He still had one NovoLog pen, which he has been using if his sugars got very high. Previously on Metformin, Avandia, Glyset.  He was on Novolog 4-6 units with meals >> stopped Novolog in 2016. He was told Novolog did not work for him (???)  He had DKA in 10/2014 (!) >> ICU.   Pt checks his sugars 2-3 a day and they are: - am: 87, 112-149, 180, 213 - 2h after b'fast: 45, 54, 100-146 - before lunch: 48, 56, 60, 81-187, 185, 301, 313 - 2h after lunch: 194-338, 451 - before dinner: 211-378 - 2h after dinner: 58, 60, 68, 76-354 - bedtime: n/c  - nighttime: n/c + lows. Lowest sugar was 48; he has hypoglycemia awareness at 50-60.  Highest sugar was 451.  Glucometer: OneTouch  Pt's meals are - about the same every day, per his report: - Breakfast: Iced coffee, breakfast bowl or eggs, sausage, toast - Lunch: Sandwich, chips, diet soda, peanut butter bars - Dinner: Meat, vegetable, starch, diet soda - Snacks: 3  He has a very physical work, no formal exercise.  - + CKD, last BUN/creatinine:  Lab Results  Component Value Date   BUN 24 11/23/2015   CREATININE 1.27 11/23/2015  He is on Losartan. - last set of lipids: No results found for: CHOL, HDL, LDLCALC, LDLDIRECT, TRIG, CHOLHDL  He is on Zocor. - last eye exam was in 11/28/2015. No DR.  - + occasional numbness and tingling in his feet.  Pt has FH of DM in PGM.  He also has a history of hypertension and  hyperlipidemia.  ROS: Constitutional: no weight gain/loss, + fatigue, no subjective hyperthermia/hypothermia, + poor sleep Eyes: no blurry vision, no xerophthalmia ENT: no sore throat, no nodules palpated in throat, no dysphagia/odynophagia, no hoarseness Cardiovascular: no CP/+ SOB/no palpitations/leg swelling Respiratory: no cough/+ SOB Gastrointestinal: no N/V/D/C, + heartburn Musculoskeletal: + muscle/+ joint aches Skin: no rashes Neurological: no tremors/numbness/tingling/dizziness Psychiatric: no depression/anxiety  Past Medical History  Diagnosis Date  . Chicken pox   . Depression   . Diabetes mellitus without complication (HCC)   . Heart murmur   . Hyperlipidemia   . Hypertension   . Kidney stone   . Diabetic ketoacidosis (HCC) 10/2014    Hospitalized in [place]    Past Surgical History  Procedure Laterality Date  . Cardiac catheterization N/A 11/25/2015    Procedure: Left Heart Cath and Coronary Angiography;  Surgeon: Laurey Morale, MD;  Location: [facility] INVASIVE CV LAB;  Service: Cardiovascular;  Laterality: N/A;   Social History Main Topics  . Smoking status: Never Smoker   . Smokeless tobacco: Not on file  . Alcohol Use: 0.0 oz/week    0 Standard drinks or equivalent per week     Comment: social  . Drug Use: No   Social History Narrative   Moved from [place].   MBA. Works as a Production designer, theatre/television/film.   1 child  that lives in [place].   Enjoys bowling, softball.      Current Outpatient Prescriptions on File Prior to Visit  Medication Sig Dispense Refill  . CINNAMON PO Take 1,000 mg by mouth daily.    . fluticasone (FLONASE) 50 MCG/ACT nasal spray Place 2 sprays into both nostrils daily as needed for allergies or rhinitis. 16 g 6  . insulin glargine (LANTUS) 100 UNIT/ML injection Inject 25 Units into the skin at bedtime.    Marland Kitchen losartan (COZAAR) 50 MG tablet Take 1 tablet (50 mg total) by mouth daily. 90 tablet 1  . Multiple Vitamins-Minerals (MULTIVITAMIN WITH MINERALS) tablet Take  1 tablet by mouth daily.    Marland Kitchen omeprazole (PRILOSEC) 20 MG capsule TAKE ONE CAPSULE BY MOUTH ONCE DAILY 30 capsule 5  . simvastatin (ZOCOR) 40 MG tablet Take 1 tablet (40 mg total) by mouth daily. 90 tablet 2   No current facility-administered medications on file prior to visit.   No Known Allergies Family History  Problem Relation Age of Onset  . Heart disease Father   . Mitral valve prolapse Father   . Lung cancer Maternal Aunt   . Emphysema Maternal Grandmother   . Lung cancer Maternal Grandmother   . Diabetes Paternal Grandmother   . Heart attack Paternal Grandfather    PE: BP 122/80 mmHg  Pulse 87  Temp(Src) 97.9 F (36.6 C) (Oral)  Resp 12  Ht  (1.753 m)  Wt 163 lb (73.936 kg)  BMI 24.06 kg/m2  SpO2 96% Wt Readings from Last 3 Encounters:  12/03/15 164 lb (74.39 kg)  11/25/15 163 lb (73.936 kg)  11/19/15 163 lb (73.936 kg)   Constitutional: Normal weight, in NAD Eyes: PERRLA, EOMI, no exophthalmos ENT: moist mucous membranes, no thyromegaly, no cervical lymphadenopathy Cardiovascular: RRR, No MRG Respiratory: CTA B Gastrointestinal: abdomen soft, NT, ND, BS+ Musculoskeletal: no deformities, strength intact in all 4 Skin: moist, warm, no rashes Neurological: no tremor with outstretched hands, DTR normal in all 4  ASSESSMENT: 1. DM, insulin-dependent, uncontrolled, with complications (hypoglycemia)  PLAN:  1. Patient with long-standing, uncontrolled diabetes, on a basal insulin only, which became insufficient. His sugars start low in a.m. and increase throughout the day, a pattern consistent with insufficient meal coverage. He has a large CBG drop from nighttime to morning and then low blood sugar throughout the day (in the 40s and 50s), consistent with too much basal insulin. I discussed with him and his fiance that I believe that he has type I rather than type 2 diabetes (and we will check for this today) since he is thin, his sugars fluctuate between 40 and  400s, and he has an episode of DKA in the past. In this case, he cannot get by without mealtime insulin. We will add this today (patient and his fiance are relieved) and, since he mentions that he is proficient in counting carbs (refuses and nutrition referral for carb counting for now), I will give him an insulin to carb ratio: 1:15 to start with, however, I anticipate that we may need to adjust this in the future.  - Since he is doing a great job checking his sugars frequently, having approximately the same type of meals and the same number of carbs with his meals, I anticipate that he will do well on this regimen.  - We did briefly discussed about an insulin pump, however, he tells me he is not interested in this for now. I mentions the Omnipod, which does not  have any tubing and allows him to be active, and he is not opposed to this in theory.  - I suggested to:  Patient Instructions  Please decrease Lantus to 25 units at bedtime.  Start Novolog - insulin to carb ratio: 1:15, 10-15 min before meals.  Please stop at the lab.  - I sent a prescription for 2x glucagon kits to the pharmacy and I showed the patient and his fiance how to use it - I gave him instruction about safe driving and also about exercising  - Strongly advised him to start checking sugars at different times of the day - check 3 times a day, rotating checks - given sugar log and advised how to fill it and to bring it at next appt  - given foot care handout and explained the principles  - given instructions for hypoglycemia management "15-15 rule"  - advised for yearly eye exams >> he is up-to-date - Return to clinic in 1.5 mo with sugar log   Orders Placed This Encounter  Procedures  . C-peptide  . Glucose, Fasting  . Glutamic acid decarboxylase auto abs  . Anti-islet cell antibody  . HM DIABETES EYE EXAM   - time spent with the patient and fiancee: 1 hour, of which >50% was spent in obtaining information about his  symptoms, reviewing his previous labs, evaluations, and treatments, counseling him about his condition (please see the discussed topics above), and developing a plan to further investigate it; he had a number of questions which I addressed.  Component     Latest Ref Rng 12/16/2015  C-Peptide     0.80-3.85 ng/mL 0.13 (L)  Glucose, Fasting     65 - 99 mg/dL 161 (H)  Glutamic Acid Decarb Ab     <5 IU/mL <5  Pancreatic Islet Cell Antibody     < 5 JDF Units <5  Labs confirm insulin deficiency, likely type 1 diabetes. Plan to continue with the basal-bolus insulin regimen, as we discussed.

## 2015-12-16 NOTE — Patient Instructions (Signed)
Please decrease Lantus to 25 units at bedtime.  Start Novolog - insulin to carb ratio: 1:15, 10-15 min before meals.  Please stop at the lab.  Basic Rules for Patients with Type I Diabetes Mellitus  1. The American Diabetes Association (ADA) recommended targets: - fasting sugar <130 - after meal sugar <180 - HbA1C <7%  2. Engage in ?150 min moderate exercise per week  3. Make sure you have ?8h of sleep every night as this helps both blood sugars and your weight.  4. Always keep a sugar log (not only record in your meter) and bring it to all appointments with [place].  5. "15-15 rule" for hypoglycemia: if sugars are low, take 15 g of carbs** ("fast sugar" - e.g. 4 glucose tablets, 4 oz orange juice), wait 15 min, then check sugars again. If still <80, repeat. Continue  until your sugars >80, then eat a normal meal.   6. Teach family members and coworkers to inject glucagon. Have a glucagon set at home and one at work. They should call 911 after using the set.  7. Check sugar before driving. If <100, correct, and only start driving if sugars rise ?161. Check sugar every hour when on a long drive.  8. Check sugar before exercising. If <100, correct, and only start exercising if sugars rise ?100. Check sugar every hour when on a long exercise routine and 1h after you finished exercising.   If >250, check urine for ketones. If you have moderate-large ketones in urine, do not start exercise. Hydrate yourself with clear liquids and correct the high sugar. Recheck sugars and ketones before attempting to exercise.  Be aware that you might need less insulin when exercising.  *intense, short, exercise bursts can increase your sugars, but  *less intense, longer (>1h), exercise routines can decrease your sugars.   9. Make sure you have a MedAlert bracelet or pendant mentioning "Type I Diabetes Mellitus". If you have a prior episode of severe hypoglycemia or hypoglycemia unawareness, it should also  mention this.  10. Please do not walk barefoot. Inspect your feet for sores/cuts and let us know if you have them.   **E.g. of "fast carbs": ? first choice (15 g):  1 tube glucose gel, GlucoPouch 15, 2 oz glucose liquid ? second choice (15-16 g):  3 or 4 glucose tablets (best taken  with water), 15 Dextrose Bits chewable ? third choice (15-20 g):   cup fruit juice,  cup regular soda, 1 cup skim milk,  1 cup sports drink ? fourth choice (15-20 g):  1 small tube [place] gel (not frosting), 2 tbsp raisins, 1 tbsp table sugar,  candy, jelly beans, gum drops - check package for carb amount   (adapted from: Juluis Rainier. "Insulin therapy and hypoglycemia" Endocrinol Metab Clin N Am 2012, 41: 57-87)

## 2015-12-17 LAB — C-PEPTIDE: C-Peptide: 0.13 ng/mL — ABNORMAL LOW (ref 0.80–3.85)

## 2015-12-17 LAB — GLUCOSE, FASTING: GLUCOSE, FASTING: 113 mg/dL — AB (ref 65–99)

## 2015-12-18 LAB — GLUTAMIC ACID DECARBOXYLASE AUTO ABS

## 2015-12-21 LAB — ANTI-ISLET CELL ANTIBODY: Pancreatic Islet Cell Antibody: <5 JDF Units (ref <5)

## 2015-12-25 ENCOUNTER — Ambulatory Visit (INDEPENDENT_AMBULATORY_CARE_PROVIDER_SITE_OTHER): Payer: BLUE CROSS/BLUE SHIELD

## 2015-12-25 DIAGNOSIS — Z23 Encounter for immunization: Secondary | ICD-10-CM | POA: Diagnosis not present

## 2016-02-04 ENCOUNTER — Ambulatory Visit (INDEPENDENT_AMBULATORY_CARE_PROVIDER_SITE_OTHER): Payer: BLUE CROSS/BLUE SHIELD | Admitting: Internal Medicine

## 2016-02-04 ENCOUNTER — Other Ambulatory Visit (INDEPENDENT_AMBULATORY_CARE_PROVIDER_SITE_OTHER): Payer: BLUE CROSS/BLUE SHIELD | Admitting: *Deleted

## 2016-02-04 ENCOUNTER — Encounter: Payer: Self-pay | Admitting: Internal Medicine

## 2016-02-04 VITALS — BP 122/80 | HR 78 | Temp 97.7°F | Resp 12 | Wt 168.0 lb

## 2016-02-04 DIAGNOSIS — E1022 Type 1 diabetes mellitus with diabetic chronic kidney disease: Secondary | ICD-10-CM

## 2016-02-04 DIAGNOSIS — E1065 Type 1 diabetes mellitus with hyperglycemia: Secondary | ICD-10-CM

## 2016-02-04 DIAGNOSIS — N182 Chronic kidney disease, stage 2 (mild): Secondary | ICD-10-CM

## 2016-02-04 DIAGNOSIS — IMO0002 Reserved for concepts with insufficient information to code with codable children: Secondary | ICD-10-CM

## 2016-02-04 LAB — POCT GLYCOSYLATED HEMOGLOBIN (HGB A1C): Hemoglobin A1C: 8.4

## 2016-02-04 NOTE — Progress Notes (Signed)
Patient ID: [place], male   DOB: September 08, 1966, 50 y.o.   MRN: [id number]  HPI: [place] is a 50 y.o.-year-old male, initially referred by his cardiologist, Dr Eden Emms, for management of DM, dx in 2002, insulin-dependent since 2006, uncontrolled, with complications (hypoglycemia). He is here with his fiancee who offers part of the hx. He moved to [facility] from [place] in July 2016. Last visit 1.5 mo ago.  Last hemoglobin A1c was: Lab Results  Component Value Date   HGBA1C 10.3* 11/12/2015   At last visit, we diagnosed type 1, rather than type 2 diabetes: Component     Latest Ref Rng 12/16/2015  C-Peptide     0.80-3.85 ng/mL 0.13 (L)  Glucose, Fasting     65 - 99 mg/dL 409 (H)  Glutamic Acid Decarb Ab     <5 IU/mL <5  Pancreatic Islet Cell Antibody     < 5 JDF Units <5   At last visit, we adjusted his insulin regimen, by adding Novolog and decreasing Lantus:  - Lantus 25 units at bedtime - Novolog -  He was using 4-6-6 units per meal (B-L-D), + SSI - target 140, ISF 30 (!!!!)  Previously on Metformin, Avandia, Glyset.  He was on Novolog 4-6 units with meals >> stopped Novolog in 2016. He was told Novolog did not work for him (???) He had DKA in 10/2014 (!) >> ICU.   Pt checks his sugars 3 a day and they are better per detailed log: - am: 87, 112-149, 180, 213 >> 65, 80-223, 301 - 2h after b'fast: 45, 54, 100-146 >> 60 - before lunch: 48, 56, 60, 81-187, 185, 301, 313 >> 58, 61-157, 236 - 2h after lunch: 194-338, 451 >> 63 - before dinner: 211-378 >> 60-226, 271, 317 - 2h after dinner: 58, 60, 68, 76-354 >> 64 - bedtime: n/c  >> 49x1 - nighttime: n/c + lows. Lowest sugar was 48 >> 49; he has hypoglycemia awareness at 50-60.  Highest sugar was 451 >> 300s.  Glucometer: OneTouch  Pt's meals are - about the same every day, per his report: - Breakfast: Iced coffee, breakfast bowl or eggs, sausage, toast - Lunch: Sandwich, chips, diet soda, peanut butter bars - Dinner: Meat, vegetable,  starch, diet soda - Snacks: 3  He has a very physical work, no formal exercise.  - + CKD, last BUN/creatinine:  Lab Results  Component Value Date   BUN 24 11/23/2015   CREATININE 1.27 11/23/2015  He is on Losartan. - last set of lipids: No results found for: CHOL, HDL, LDLCALC, LDLDIRECT, TRIG, CHOLHDL  He is on Zocor. - last eye exam was in 11/28/2015. No DR.  - + occasional numbness and tingling in his feet.  He also has a history of hypertension and hyperlipidemia.  ROS: Constitutional: no weight gain/loss, no fatigue, no subjective hyperthermia/hypothermia Eyes: no blurry vision, no xerophthalmia ENT: no sore throat, no nodules palpated in throat, no dysphagia/odynophagia, no hoarseness Cardiovascular: no CP/SOB/palpitations/leg swelling Respiratory: no cough/SOB Gastrointestinal: no N/V/D/C Musculoskeletal: no muscle/joint aches Skin: no rashes Neurological: no tremors/numbness/tingling/dizziness  I reviewed pt's medications, allergies, PMH, social hx, family hx, and changes were documented in the history of present illness. Otherwise, unchanged from my initial visit note.  Past Medical History  Diagnosis Date  . Chicken pox   . Depression   . Diabetes mellitus without complication (HCC)   . Heart murmur   . Hyperlipidemia   . Hypertension   . Kidney stone   .  Diabetic ketoacidosis (HCC) 10/2014    Hospitalized in [place]    Past Surgical History  Procedure Laterality Date  . Cardiac catheterization N/A 11/25/2015    Procedure: Left Heart Cath and Coronary Angiography;  Surgeon: Laurey Morale, MD;  Location: [facility] INVASIVE CV LAB;  Service: Cardiovascular;  Laterality: N/A;   Social History Main Topics  . Smoking status: Never Smoker   . Smokeless tobacco: Not on file  . Alcohol Use: 0.0 oz/week    0 Standard drinks or equivalent per week     Comment: social  . Drug Use: No   Social History Narrative   Moved from [place].   MBA. Works as a Production designer, theatre/television/film.   1  child that lives in [place].   Enjoys bowling, softball.      Current Outpatient Prescriptions on File Prior to Visit  Medication Sig Dispense Refill  . CINNAMON PO Take 1,000 mg by mouth daily.    . fluticasone (FLONASE) 50 MCG/ACT nasal spray Place 2 sprays into both nostrils daily as needed for allergies or rhinitis. 16 g 6  . glucagon (GLUCAGON EMERGENCY) 1 MG injection Inject 1 mg into the vein once as needed. 2 each prn  . insulin aspart (NOVOLOG FLEXPEN) 100 UNIT/ML FlexPen Inject 4-6 Units into the skin 3 (three) times daily before meals. 15 mL 2  . insulin glargine (LANTUS) 100 UNIT/ML injection Inject 25 Units into the skin at bedtime.    Marland Kitchen losartan (COZAAR) 50 MG tablet Take 1 tablet (50 mg total) by mouth daily. 90 tablet 1  . Multiple Vitamins-Minerals (MULTIVITAMIN WITH MINERALS) tablet Take 1 tablet by mouth daily.    Marland Kitchen omeprazole (PRILOSEC) 20 MG capsule TAKE ONE CAPSULE BY MOUTH ONCE DAILY 30 capsule 5  . simvastatin (ZOCOR) 40 MG tablet Take 1 tablet (40 mg total) by mouth daily. 90 tablet 2   No current facility-administered medications on file prior to visit.   No Known Allergies Family History  Problem Relation Age of Onset  . Heart disease Father   . Mitral valve prolapse Father   . Lung cancer Maternal Aunt   . Emphysema Maternal Grandmother   . Lung cancer Maternal Grandmother   . Diabetes Paternal Grandmother   . Heart attack Paternal Grandfather    PE: BP 122/80 mmHg  Pulse 78  Temp(Src) 97.7 F (36.5 C) (Oral)  Resp 12  Wt 168 lb (76.204 kg)  SpO2 95% Body mass index is 24.8 kg/(m^2).  Wt Readings from Last 3 Encounters:  02/04/16 168 lb (76.204 kg)  12/16/15 163 lb (73.936 kg)  12/03/15 164 lb (74.39 kg)   Constitutional: Normal weight, in NAD Eyes: PERRLA, EOMI, no exophthalmos ENT: moist mucous membranes, no thyromegaly, no cervical lymphadenopathy Cardiovascular: RRR, No MRG Respiratory: CTA B Gastrointestinal: abdomen soft, NT, ND,  BS+ Musculoskeletal: no deformities, strength intact in all 4 Skin: moist, warm, no rashes Neurological: no tremor with outstretched hands, DTR normal in all 4  ASSESSMENT: 1. DM1, insulin-dependent, uncontrolled, with complications (hypoglycemia)  PLAN:  1. Patient with long-standing, uncontrolled diabetes, which we dx'ed as DM1 at last visit. He was previously on basal insulin only, with insufficient control. We added Novolog at last visit, and, since he mentioned that he was proficient in counting carbs (refused and nutrition referral for carb counting), I gave him an insulin to carb ratio: 1:15 to start with. He did not use this, but used his previus insulin regimen from former endo: 4-6-6 units with B-L-D + SSI: target  140, ISF 30, which I believe is excessive. He has hypoglycemia after meals >> advised to use the ICR of 1:15 and not use fixed doses. We also added a more permissive SSI: target 150, ISF 50. To avoid further lows, we will also decrease Lantu. - he is doing a great job checking his sugars frequently, having approximately the same type of meals and the same number of carbs with his meals. - We did briefly discussed about an insulin pump at last visit >> not interested in this for now. I mentioned the Omnipod, which does not have any tubing and allows him to be active, and he is not opposed to this in theory.  - I suggested to:  Patient Instructions  Please decrease Lantus to 22 units daily.  Please decrease Novolog: - ICR 1:15 - Sliding scale: 150-200: + 1 unit 201-250: + 2 units 251-300: + 3 units 301-350: + 4 units >350: + 5 units  Please inject Novolog for snacks if the snacks contain more than 15g of carbs. If you go for a walk after dinner, please do not bolus for the snack that night.  Your HbA1c today is 8.4%!!!!  - continue checking sugars at different times of the day - check 3 times a day, rotating checks - advised for yearly eye exams >> he is up-to-date -  checked HbA1c today >> 8.4% (better!) - Return to clinic in 1.5 mo with sugar log

## 2016-02-04 NOTE — Patient Instructions (Signed)
Please decrease Lantus to 22 units daily.  Please decrease Novolog: - ICR 1:15 - Sliding scale: 150-200: + 1 unit 201-250: + 2 units 251-300: + 3 units 301-350: + 4 units >350: + 5 units  Please inject Novolog for snacks if the snacks contain more than 15g of carbs. If you go for a walk after dinner, please do not bolus for the snack that night.  Your HbA1c today is 8.4%!!!!

## 2016-02-15 ENCOUNTER — Encounter: Payer: Self-pay | Admitting: Primary Care

## 2016-02-15 ENCOUNTER — Other Ambulatory Visit: Payer: Self-pay | Admitting: Primary Care

## 2016-02-15 DIAGNOSIS — I10 Essential (primary) hypertension: Secondary | ICD-10-CM

## 2016-02-15 MED ORDER — LOSARTAN POTASSIUM 50 MG PO TABS: 50.0000 mg | ORAL_TABLET | Freq: Every day | ORAL | Status: DC

## 2016-03-23 ENCOUNTER — Encounter: Payer: Self-pay | Admitting: Primary Care

## 2016-03-24 ENCOUNTER — Encounter: Payer: Self-pay | Admitting: Internal Medicine

## 2016-03-24 ENCOUNTER — Ambulatory Visit (INDEPENDENT_AMBULATORY_CARE_PROVIDER_SITE_OTHER): Payer: BLUE CROSS/BLUE SHIELD | Admitting: Internal Medicine

## 2016-03-24 VITALS — BP 123/84 | HR 85 | Temp 97.7°F | Ht 69.0 in | Wt 168.6 lb

## 2016-03-24 DIAGNOSIS — N182 Chronic kidney disease, stage 2 (mild): Secondary | ICD-10-CM

## 2016-03-24 DIAGNOSIS — E1065 Type 1 diabetes mellitus with hyperglycemia: Secondary | ICD-10-CM | POA: Diagnosis not present

## 2016-03-24 DIAGNOSIS — IMO0002 Reserved for concepts with insufficient information to code with codable children: Secondary | ICD-10-CM

## 2016-03-24 DIAGNOSIS — E1022 Type 1 diabetes mellitus with diabetic chronic kidney disease: Secondary | ICD-10-CM | POA: Diagnosis not present

## 2016-03-24 LAB — LIPID PANEL
Cholesterol: 165 mg/dL (ref 0–200)
HDL: 65.7 mg/dL
LDL Cholesterol: 91 mg/dL (ref 0–99)
NonHDL: 99.52
Total CHOL/HDL Ratio: 3
Triglycerides: 44 mg/dL (ref 0.0–149.0)
VLDL: 8.8 mg/dL (ref 0.0–40.0)

## 2016-03-24 NOTE — Progress Notes (Signed)
Patient ID: [place], male   DOB: 03/07/1966, 50 y.o.   MRN: [id number]  HPI: [place] is a 50 y.o.-year-old male, initially referred by [place] cardiologist, Dr Eden Emms, for management of DM, dx in 2002, insulin-dependent since 2006, uncontrolled, with complications (hypoglycemia, CKD stage 2). He is here with [place] who offers part of the hx. He moved to [facility] from [place] in July 2016. Last visit 1.5 mo ago.  Last hemoglobin A1c was: Lab Results  Component Value Date   HGBA1C 8.4 02/04/2016   HGBA1C 10.3* 11/12/2015   At last visit, we adjusted [place] insulin regimen, by adding Novolog and decreasing Lantus:  - Lantus 25 >> 22 units at bedtime - Novolog -  >> ICR 1:15, ISF 50 - Sliding scale: 150-200: + 1 unit 201-250: + 2 units 251-300: + 3 units 301-350: + 4 units >350: + 5 units Previously on Metformin, Avandia, Glyset.  He was on Novolog 4-6 units with meals >> stopped Novolog in 2016. He was told Novolog did not work for him (???) He had DKA in 10/2014 (!) >> ICU.   Pt checks [place] sugars 3 a day and they are better per detailed log: - am: 87, 112-149, 180, 213 >> 65, 80-223, 301 >> 46x1, 72-211, 249 (4 units) - 2h after b'fast: 45, 54, 100-146 >> 60 >> n/c - before lunch: 48, 56, 60, 81-187, 185, 301, 313 >> 58, 61-157, 236 >> 55-176, 229 (6-7 units) - 2h after lunch: 194-338, 451 >> 63 >> 38 - before dinner: 211-378 >> 60-226, 271, 317 >> 59-300 (6-7 units) - 2h after dinner: 58, 60, 68, 76-354 >> 44-52, 189, 363 - bedtime: n/c  >> 49x1 >> 53 - nighttime: n/c + lows. Lowest sugar was 48 >> 49 >> 38x1; he has hypoglycemia awareness at 50-60.  Highest sugar was 451 >> 300s.  Glucometer: OneTouch  Pt's meals are - about the same every day, per [place] report: - Breakfast: Iced coffee, breakfast bowl or eggs, sausage, toast - Lunch: Sandwich, chips, diet soda, peanut butter bars - Dinner: Meat, vegetable, starch, diet soda - Snacks: 3  He has a very physical work, plays soccer  sometimes in the evening.  - + CKD stage 2, last BUN/creatinine:  Lab Results  Component Value Date   BUN 24 11/23/2015   CREATININE 1.27 11/23/2015  He is on Losartan. - last set of lipids: No results found for: CHOL, HDL, LDLCALC, LDLDIRECT, TRIG, CHOLHDL  He is on Zocor. - last eye exam was in 11/28/2015. No DR.  - + occasional numbness and tingling in [place] feet.  He also has a history of hypertension and hyperlipidemia.  ROS: Constitutional: no weight gain/loss, no fatigue, no subjective hyperthermia/hypothermia Eyes: no blurry vision, no xerophthalmia ENT: no sore throat, no nodules palpated in throat, no dysphagia/odynophagia, no hoarseness Cardiovascular: no CP/SOB/palpitations/leg swelling Respiratory: no cough/SOB Gastrointestinal: no N/V/D/C Musculoskeletal: no muscle/joint aches Skin: no rashes Neurological: no tremors/numbness/tingling/dizziness  I reviewed pt's medications, allergies, PMH, social hx, family hx, and changes were documented in the history of present illness. Otherwise, unchanged from my initial visit note.  Past Medical History  Diagnosis Date  . Chicken pox   . Depression   . Diabetes mellitus without complication (HCC)   . Heart murmur   . Hyperlipidemia   . Hypertension   . Kidney stone   . Diabetic ketoacidosis (HCC) 10/2014    Hospitalized in [place]    Past Surgical History  Procedure Laterality Date  .  Cardiac catheterization N/A 11/25/2015    Procedure: Left Heart Cath and Coronary Angiography;  Surgeon: Laurey Morale, MD;  Location: [facility] INVASIVE CV LAB;  Service: Cardiovascular;  Laterality: N/A;   Social History Main Topics  . Smoking status: Never Smoker   . Smokeless tobacco: Not on file  . Alcohol Use: 0.0 oz/week    0 Standard drinks or equivalent per week     Comment: social  . Drug Use: No   Social History Narrative   Moved from [place].   MBA. Works as a Production designer, theatre/television/film.   1 [place] that lives in [place].   Enjoys bowling, softball.       Current Outpatient Prescriptions on File Prior to Visit  Medication Sig Dispense Refill  . CINNAMON PO Take 1,000 mg by mouth daily.    . fluticasone (FLONASE) 50 MCG/ACT nasal spray Place 2 sprays into both nostrils daily as needed for allergies or rhinitis. 16 g 6  . glucagon (GLUCAGON EMERGENCY) 1 MG injection Inject 1 mg into the vein once as needed. 2 each prn  . insulin aspart (NOVOLOG FLEXPEN) 100 UNIT/ML FlexPen Inject 4-6 Units into the skin 3 (three) times daily before meals. 15 mL 2  . insulin glargine (LANTUS) 100 UNIT/ML injection Inject 22 Units into the skin at bedtime.    Marland Kitchen losartan (COZAAR) 50 MG tablet Take 1 tablet (50 mg total) by mouth daily. 90 tablet 1  . Multiple Vitamins-Minerals (MULTIVITAMIN WITH MINERALS) tablet Take 1 tablet by mouth daily.    Marland Kitchen omeprazole (PRILOSEC) 20 MG capsule TAKE ONE CAPSULE BY MOUTH ONCE DAILY 30 capsule 5  . simvastatin (ZOCOR) 40 MG tablet Take 1 tablet (40 mg total) by mouth daily. 90 tablet 2   No current facility-administered medications on file prior to visit.   No Known Allergies Family History  Problem Relation Age of Onset  . Heart disease Father   . Mitral valve prolapse Father   . Lung cancer Maternal Aunt   . Emphysema Maternal Grandmother   . Lung cancer Maternal Grandmother   . Diabetes Paternal Grandmother   . Heart attack Paternal Grandfather    PE: BP 123/84 mmHg  Pulse 85  Temp(Src) 97.7 F (36.5 C) (Oral)  Ht 5\' 9"  (1.753 m)  Wt 168 lb 9.6 oz (76.476 kg)  BMI 24.89 kg/m2  SpO2 98% Body mass index is 24.89 kg/(m^2).  Wt Readings from Last 3 Encounters:  03/24/16 168 lb 9.6 oz (76.476 kg)  02/04/16 168 lb (76.204 kg)  12/16/15 163 lb (73.936 kg)   Constitutional: Normal weight, in NAD Eyes: PERRLA, EOMI, no exophthalmos ENT: moist mucous membranes, no thyromegaly, no cervical lymphadenopathy Cardiovascular: RRR, No MRG Respiratory: CTA B Gastrointestinal: abdomen soft, NT, ND,  BS+ Musculoskeletal: no deformities, strength intact in all 4 Skin: moist, warm, no rashes Neurological: no tremor with outstretched hands, DTR normal in all 4  ASSESSMENT: 1. DM1, insulin-dependent, uncontrolled, with complications  - hypoglycemia - CKD stage 2  Component     Latest Ref Rng 12/16/2015  C-Peptide     0.80-3.85 ng/mL 0.13 (L)  Glucose, Fasting     65 - 99 mg/dL 161 (H)  Glutamic Acid Decarb Ab     <5 IU/mL <5  Pancreatic Islet Cell Antibody     < 5 JDF Units <5   - We discussed about an insulin pump at a previous visit >> not interested in this. I mentioned the Omnipod, which does not have any tubing and allows  him to be active, and he was not opposed to this in theory.   PLAN:  1. Patient with long-standing, uncontrolled diabetes, which we dx'ed as DM1. He was previously on basal insulin only, with insufficient control. We added Novolog and, since he mentioned that he was proficient in counting carbs (refused and nutrition referral for carb counting) >> advised to use the ICR of 1:15 and not use fixed doses. We also changed to a more permissive SSI: target 150, ISF 50 at last visit. To avoid further lows, we will also decreased Lantus at last visit. He still has many lows, 1 at 40!, with lower sugars during the week, when he is more active, but also after meals >> will increase [place] ICR (subtract 1 unit from the calculated dose. - he is doing a great job checking [place] sugars frequently, having approximately the same type of meals and the same number of carbs with [place] meals. - we again discussed about pumps or a least a CGM >> he is not sure if [place] insurance covers them >> will refer to Cristy Folks, to find more about pumps and CGMs and decide for one before checking with insurance if they cover it. - I suggested to:  Patient Instructions  Please continue: - Lantus 22 units at bedtime  Please change: - Novolog  ICR 1:15 (but subtract 1 unit!) , ISF 50  Please stop  at the lab.  Please schedule an appt with Cristy Folks for diabetes education.  - continue checking sugars at different times of the day - check 3 times a day, rotating checks - advised for yearly eye exams >> he is up-to-date - will check Lipids today (nonfasting) - Return to clinic in 1.5 mo with sugar log   Office Visit on 03/24/2016  Component Date Value Ref Range Status  . Cholesterol 03/24/2016 165  0 - 200 mg/dL Final   ATP III Classification       Desirable:  < 200 mg/dL               Borderline High:  200 - 239 mg/dL          High:  > = 562 mg/dL  . Triglycerides 03/24/2016 44.0  0.0 - 149.0 mg/dL Final   Normal:  <130 mg/dLBorderline High:  150 - 199 mg/dL  . HDL 03/24/2016 65.70  >39.00 mg/dL Final  . VLDL 86/57/8469 8.8  0.0 - 62.9 mg/dL Final  . LDL Cholesterol 03/24/2016 91  0 - 99 mg/dL Final  . Total CHOL/HDL Ratio 03/24/2016 3   Final                  Men          Women1/2 Average Risk     3.4          3.3Average Risk          5.0          4.42X Average Risk          9.6          7.13X Average Risk          15.0          11.0                      . NonHDL 03/24/2016 99.52   Final   NOTE:  Non-HDL goal should be 30 mg/dL higher than patient's LDL goal (i.e. LDL goal of <  70 mg/dL, would have non-HDL goal of < 100 mg/dL)   Normal lipids.

## 2016-03-24 NOTE — Patient Instructions (Addendum)
Please continue: - Lantus 22 units at bedtime  Please change: - Novolog  ICR 1:15 (but subtract 1 unit!) , ISF 50  Please stop at the lab.  Please schedule an appt with Cristy FolksLinda Spagnola for diabetes education.

## 2016-03-24 NOTE — Progress Notes (Signed)
Pre visit review using our clinic tool,if applicable. No additional management support is needed unless otherwise documented below in the visit note.  

## 2016-03-30 ENCOUNTER — Encounter: Payer: Self-pay | Admitting: Family Medicine

## 2016-03-30 ENCOUNTER — Ambulatory Visit (INDEPENDENT_AMBULATORY_CARE_PROVIDER_SITE_OTHER): Payer: BLUE CROSS/BLUE SHIELD | Admitting: Family Medicine

## 2016-03-30 VITALS — BP 126/80 | HR 85 | Temp 98.5°F | Ht 69.0 in | Wt 169.5 lb

## 2016-03-30 DIAGNOSIS — S76912A Strain of unspecified muscles, fascia and tendons at thigh level, left thigh, initial encounter: Secondary | ICD-10-CM

## 2016-03-30 NOTE — Progress Notes (Signed)
Pre visit review using our clinic review tool, if applicable. No additional management support is needed unless otherwise documented below in the visit note. 

## 2016-03-30 NOTE — Progress Notes (Signed)
Dr. Karleen Hampshire T. Avianah Pellman, MD, CAQ [facility] [address] [place], [address] Phone: [phone] Fax: [phone]  03/30/2016  Patient: Dakota Sutton, MRN: [id number], DOB: 1965-12-06, 50 y.o.  Primary Physician:  Morrie Sheldon, NP   Chief Complaint  Patient presents with  . Leg Pain    Left thigh   Subjective:   Dakota Sutton is a 50 y.o. very pleasant male patient who presents with the following:  Playing some softball, did not get hit by and had a quad tear. He was struck in the left anterior thigh about 3 weeks ago, since then he has had some pain.  He had some extensive bruising.  His quadriceps function is maintained.  Extension is preserved.  He has had pain and there is a mild defect in the anterior portion of his thigh.  He is having mild weakness only with extension and flexion at the hip.  He is accompanied by his wife.  Lab Results  Component Value Date   HGBA1C 8.4 02/04/2016      Past Medical History, Surgical History, Social History, Family History, Problem List, Medications, and Allergies have been reviewed and updated if relevant.  Patient Active Problem List   Diagnosis Date Noted  . 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 Essential hypertension 08/12/2015  . Hyperlipidemia LDL goal <100 08/12/2015    Past Medical History  Diagnosis Date  . Chicken pox   . Depression   . Diabetes mellitus without complication (HCC)   . Heart murmur   . Hyperlipidemia   . Hypertension   . Kidney stone   . Diabetic ketoacidosis (HCC) 10/2014    Hospitalized in [place]     Past Surgical History  Procedure Laterality Date  . Cardiac catheterization N/A 11/25/2015    Procedure: Left Heart Cath and Coronary Angiography;  Surgeon: Laurey Morale, MD;  Location: [facility] INVASIVE CV LAB;  Service: Cardiovascular;  Laterality: N/A;    Social History   Social History    . Marital Status: Married    Spouse Name: N/A  . Number of Children: N/A  . Years of Education: N/A   Occupational History  . Not on file.   Social History Main Topics  . Smoking status: Never Smoker   . Smokeless tobacco: Never Used  . Alcohol Use: 0.0 oz/week    0 Standard drinks or equivalent per week     Comment: social  . Drug Use: No  . Sexual Activity: Not on file   Other Topics Concern  . Not on file   Social History Narrative   Moved from [place].   MBA. Works as a Production designer, theatre/television/film.   1 child that lives in [place].   Enjoys bowling, softball.       Family History  Problem Relation Age of Onset  . Heart disease Father   . Mitral valve prolapse Father   . Lung cancer Maternal Aunt   . Emphysema Maternal Grandmother   . Lung cancer Maternal Grandmother   . Diabetes Paternal Grandmother   . Heart attack Paternal Grandfather     No Known Allergies  Medication list reviewed and updated in full in [place] [facility].  GEN: No fevers, chills. Nontoxic. Primarily MSK c/o today. MSK: Detailed in the HPI GI: tolerating PO intake without difficulty Neuro: No numbness, parasthesias, or tingling associated. Otherwise the pertinent positives of the ROS are noted above.  Objective:   BP 126/80 mmHg  Pulse 85  Temp(Src) 98.5 F (36.9 C) (Oral)  Ht 5\' 9"  (1.753 m)  Wt 169 lb 8 oz (76.885 kg)  BMI 25.02 kg/m2   GEN: Well-developed,well-nourished,in no acute distress; alert,appropriate and cooperative throughout examination HEENT: Normocephalic and atraumatic without obvious abnormalities. Ears, externally no deformities PULM: Breathing comfortably in no respiratory distress EXT: No clubbing, cyanosis, or edema PSYCH: Normally interactive. Cooperative during the interview. Pleasant. Friendly and conversant. Not anxious or depressed appearing. Normal, full affect.  Full range of motion at the hip on the left.  There is currently no significant bruising.  There is a mild defect in  the midportion of his thigh anteriorly, but the patient patient is able to fully extend his leg without difficulty with 4+ plus/5 strength.  There appears to be no quadricep tendon deficit.  He is also able to flex at the hip without difficulty with 4+ plus/5 strength.  Hip abduction and abduction is 5/5.  Radiology: No results found.  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  Quad tendon function is intact and preserved.  Strength remains good.  Counseled to and his softball season early.  For the next 2-3 weeks, suggested no stretching and no ballistic movements whatsoever.  At that point he can begin some basic strengthening such as leg extension and hip flexion along with some basic stretching movements.  He is going to follow-up with me to an sure he is adequately progressing.  Follow-up: Return in about 5 weeks (around 05/04/2016).  Signed,  Elpidio GaleaSpencer T. Thang Flett, MD   Patient's Medications  New Prescriptions   No medications on file  Previous Medications   CINNAMON PO    Take 1,000 mg by mouth daily.   FLUTICASONE (FLONASE) 50 MCG/ACT NASAL SPRAY    Place 2 sprays into both nostrils daily as needed for allergies or rhinitis.   GLUCAGON (GLUCAGON EMERGENCY) 1 MG INJECTION    Inject 1 mg into the vein once as needed.   INSULIN ASPART (NOVOLOG FLEXPEN) 100 UNIT/ML FLEXPEN    Inject 4-6 Units into the skin 3 (three) times daily before meals.   LANTUS SOLOSTAR 100 UNIT/ML SOLOSTAR PEN    Inject 22 Units into the skin at bedtime.    LOSARTAN (COZAAR) 50 MG TABLET    Take 1 tablet (50 mg total) by mouth daily.   MULTIPLE VITAMINS-MINERALS (MULTIVITAMIN WITH MINERALS) TABLET    Take 1 tablet by mouth daily.   NOVOFINE 30G X 8 MM MISC       OMEPRAZOLE (PRILOSEC) 20 MG CAPSULE    TAKE  ONE CAPSULE BY MOUTH ONCE DAILY   ONE TOUCH ULTRA TEST TEST STRIP       SIMVASTATIN (ZOCOR) 40 MG TABLET    Take 1 tablet (40 mg total) by mouth daily.  Modified Medications   No medications on file  Discontinued Medications   INSULIN GLARGINE (LANTUS) 100 UNIT/ML INJECTION    Inject 22 Units into the skin at bedtime.

## 2016-04-22 ENCOUNTER — Ambulatory Visit (INDEPENDENT_AMBULATORY_CARE_PROVIDER_SITE_OTHER): Payer: BLUE CROSS/BLUE SHIELD | Admitting: Primary Care

## 2016-04-22 VITALS — BP 142/86 | HR 86 | Temp 98.2°F | Ht 69.0 in | Wt 165.4 lb

## 2016-04-22 DIAGNOSIS — M2540 Effusion, unspecified joint: Secondary | ICD-10-CM | POA: Diagnosis not present

## 2016-04-22 MED ORDER — MELOXICAM 15 MG PO TABS: 15.0000 mg | ORAL_TABLET | Freq: Every day | ORAL | Status: DC | PRN

## 2016-04-22 NOTE — Progress Notes (Signed)
Subjective:    Patient ID: Dakota Sutton, male    DOB: [birth year], 50 y.o.   MRN: [id number]  HPI  [name] is a 50 year old male who presents today with a chief complaint of hand pain and swelling. His pain and swelling are located to his bilateral middle fingers at the PIP and metacarpal joints. He works with his hands every day at work by [employer], typing and, assembling things. He has recently increased his work hours and has been working with his hands more frequently than usual. His symptoms have been present since last night  He's not taken anything for his symptoms except for applying diclofenac cream and ice. Denies recent injury or trauma, fatigue, numbness or tingling.  Review of Systems  Musculoskeletal: Positive for joint swelling and arthralgias. Negative for myalgias.  Neurological: Negative for numbness.       Past Medical History  Diagnosis Date  . Chicken pox   . Depression   . Diabetes mellitus without complication (HCC)   . Heart murmur   . Hyperlipidemia   . Hypertension   . Kidney stone   . Diabetic ketoacidosis (HCC) 10/2014    Hospitalized in [place]      Social History   Social History  . Marital Status: Married    Spouse Name: N/A  . Number of Children: N/A  . Years of Education: N/A   Occupational History  . Not on file.   Social History Main Topics  . Smoking status: Never Smoker   . Smokeless tobacco: Never Used  . Alcohol Use: 0.0 oz/week    0 Standard drinks or equivalent per week     Comment: social  . Drug Use: No  . Sexual Activity: Not on file   Other Topics Concern  . Not on file   Social History Narrative   Moved from [place].   MBA. Works as a Production designer, theatre/television/filmmanager.   1 child that lives in [place].   Enjoys bowling, softball.       Past Surgical History  Procedure Laterality Date  . Cardiac catheterization N/A 11/25/2015    Procedure: Left Heart Cath and Coronary Angiography;  Surgeon: Laurey Moralealton S McLean, MD;  [facility]  [facility];  Service: Cardiovascular;  Laterality: N/A;    Family History  Problem Relation Age of Onset  . Heart disease Father   . Mitral valve prolapse Father   . Lung cancer Maternal Aunt   . Emphysema Maternal Grandmother   . Lung cancer Maternal Grandmother   . Diabetes Paternal Grandmother   . Heart attack Paternal Grandfather     No Known Allergies  Current Outpatient Prescriptions on File Prior to Visit  Medication Sig Dispense Refill  . CINNAMON PO Take 1,000 mg by mouth daily.    . fluticasone (FLONASE) 50 MCG/ACT nasal spray Place 2 sprays into both nostrils daily as needed for allergies or rhinitis. 16 g 6  . glucagon (GLUCAGON EMERGENCY) 1 MG injection Inject 1 mg into the vein once as needed. 2 each prn  . insulin aspart (NOVOLOG FLEXPEN) 100 UNIT/ML FlexPen Inject 4-6 Units into the skin 3 (three) times daily before meals. 15 mL 2  . LANTUS SOLOSTAR 100 UNIT/ML Solostar Pen Inject 22 Units into the skin at bedtime.   5  . losartan (COZAAR) 50 MG tablet Take 1 tablet (50 mg total) by mouth daily. 90 tablet 1  . Multiple Vitamins-Minerals (MULTIVITAMIN WITH MINERALS) tablet Take 1 tablet by mouth daily.    .Marland Kitchen  NOVOFINE 30G X 8 MM MISC   3  . omeprazole (PRILOSEC) 20 MG capsule TAKE ONE CAPSULE BY MOUTH ONCE DAILY 30 capsule 5  . ONE TOUCH ULTRA TEST test strip   2  . simvastatin (ZOCOR) 40 MG tablet Take 1 tablet (40 mg total) by mouth daily. 90 tablet 2   No current facility-administered medications on file prior to visit.    BP 142/86 mmHg  Pulse 86  Temp(Src) 98.2 F (36.8 C) (Oral)  Ht 5\' 9"  (1.753 m)  Wt 165 lb 6.4 oz (75.025 kg)  BMI 24.41 kg/m2  SpO2 98%    Objective:   Physical Exam  Constitutional: He appears well-nourished.  Cardiovascular: Normal rate and regular rhythm.   Pulmonary/Chest: Effort normal and breath sounds normal.  Musculoskeletal:  Moderate swelling to fingers of bilateral hands, stiffness to PIP joints to third digits bilaterally.  Decreased range of motion to both hands.  Skin: Skin is warm and dry.          Assessment & Plan:  Arthritis:  Joint and hand swelling bilaterally since last night. Works with his hands repetitively at work and has recently increased work hours. Swelling with stiffness noted to PIP joints to third digits bilaterally. Will treat with anti-inflammatory meloxicam, ice, rest. He is to notify me if no improvement within the next couple of days. If swelling and symptoms persist will consider rheumatology workup.  Morrie Sheldonlark,Katherine Kendal, NP

## 2016-04-22 NOTE — Patient Instructions (Signed)
Start Meloxicam tablets for pain and inflammation. Take 1 tablet by mouth once daily.  You may continue to ice and use cream as discussed.  Keep me updated if no improvement.  It was a pleasure to see you today!

## 2016-04-22 NOTE — Progress Notes (Signed)
Pre visit review using our clinic review tool, if applicable. No additional management support is needed unless otherwise documented below in the visit note. 

## 2016-05-11 ENCOUNTER — Ambulatory Visit (INDEPENDENT_AMBULATORY_CARE_PROVIDER_SITE_OTHER): Payer: BLUE CROSS/BLUE SHIELD | Admitting: Primary Care

## 2016-05-11 VITALS — BP 130/82 | HR 74 | Temp 97.9°F | Ht 69.0 in | Wt 169.4 lb

## 2016-05-11 DIAGNOSIS — IMO0002 Reserved for concepts with insufficient information to code with codable children: Secondary | ICD-10-CM

## 2016-05-11 DIAGNOSIS — Z125 Encounter for screening for malignant neoplasm of prostate: Secondary | ICD-10-CM

## 2016-05-11 DIAGNOSIS — N182 Chronic kidney disease, stage 2 (mild): Secondary | ICD-10-CM

## 2016-05-11 DIAGNOSIS — E1022 Type 1 diabetes mellitus with diabetic chronic kidney disease: Secondary | ICD-10-CM

## 2016-05-11 DIAGNOSIS — Z23 Encounter for immunization: Secondary | ICD-10-CM

## 2016-05-11 DIAGNOSIS — Z1211 Encounter for screening for malignant neoplasm of colon: Secondary | ICD-10-CM

## 2016-05-11 DIAGNOSIS — Z0001 Encounter for general adult medical examination with abnormal findings: Secondary | ICD-10-CM

## 2016-05-11 DIAGNOSIS — Z0000 Encounter for general adult medical examination without abnormal findings: Secondary | ICD-10-CM | POA: Insufficient documentation

## 2016-05-11 DIAGNOSIS — M2540 Effusion, unspecified joint: Secondary | ICD-10-CM

## 2016-05-11 DIAGNOSIS — E1065 Type 1 diabetes mellitus with hyperglycemia: Secondary | ICD-10-CM

## 2016-05-11 DIAGNOSIS — I10 Essential (primary) hypertension: Secondary | ICD-10-CM

## 2016-05-11 DIAGNOSIS — E785 Hyperlipidemia, unspecified: Secondary | ICD-10-CM

## 2016-05-11 LAB — COMPREHENSIVE METABOLIC PANEL
ALK PHOS: 65 U/L (ref 39–117)
ALT: 19 U/L (ref 0–53)
AST: 18 U/L (ref 0–37)
Albumin: 3.9 g/dL (ref 3.5–5.2)
BILIRUBIN TOTAL: 0.6 mg/dL (ref 0.2–1.2)
BUN: 29 mg/dL — AB (ref 6–23)
CO2: 28 meq/L (ref 19–32)
CREATININE: 1.28 mg/dL (ref 0.40–1.50)
Calcium: 9.2 mg/dL (ref 8.4–10.5)
Chloride: 104 mEq/L (ref 96–112)
GFR: 62.62 mL/min (ref >60.00)
GLUCOSE: 227 mg/dL — AB (ref 70–99)
Potassium: 5 mEq/L (ref 3.5–5.1)
Sodium: 137 mEq/L (ref 135–145)
TOTAL PROTEIN: 6.3 g/dL (ref 6.0–8.3)

## 2016-05-11 LAB — PSA: PSA: 1.87 ng/mL (ref 0.10–4.00)

## 2016-05-11 MED ORDER — MELOXICAM 15 MG PO TABS: 15.0000 mg | ORAL_TABLET | Freq: Every day | ORAL | Status: AC | PRN

## 2016-05-11 NOTE — Progress Notes (Signed)
Pre visit review using our clinic review tool, if applicable. No additional management support is needed unless otherwise documented below in the visit note. 

## 2016-05-11 NOTE — Addendum Note (Signed)
Addended by: Liane ComberHAVERS, Anzel Kearse C on: 05/11/2016 10:25 AM   Modules accepted: Kipp BroodSmartSet

## 2016-05-11 NOTE — Assessment & Plan Note (Signed)
Well controlled and stable today. Continue Losartan.

## 2016-05-11 NOTE — Progress Notes (Signed)
Subjective:    Patient ID: Dakota Sutton, male    DOB: 1966-06-19, 50 y.o.   MRN: [id number]  HPI  Dakota Sutton is a 50 year old male who presents today for complete physical.  Immunizations: -Tetanus: Unsure, believes it's been over 10 years.  -Influenza: Completed in 2017 -Pneumonia: Completed 4 years ago.   Diet:  Breakfast: Eggs, sausage, danish Lunch: Sandwich, chips Dinner: Meat, vegetable, starch Snacks: Sweets at night, nuts, cheese Desserts: Occasionally at night Beverages: Iced coffee, juice, diet soda, unsweet tea, limited water  Exercise: He does not currently exercise. Eye exam: Completed February 2017 Dental exam: Due, completed 1 year ago. Colonoscopy: Never completed, due.    Review of Systems  Constitutional: Negative for unexpected weight change.  HENT: Negative for rhinorrhea.   Respiratory: Negative for cough and shortness of breath.   Cardiovascular: Negative for chest pain.  Gastrointestinal: Negative for diarrhea and constipation.  Genitourinary: Negative for difficulty urinating.  Musculoskeletal: Negative for myalgias and arthralgias.  Skin: Negative for rash.  Allergic/Immunologic: Positive for environmental allergies.  Neurological: Negative for dizziness, numbness and headaches.  Psychiatric/Behavioral:       Denies concerns for anxiery or depression       Past Medical History  Diagnosis Date  . Chicken pox   . Depression   . Diabetes mellitus without complication (HCC)   . Heart murmur   . Hyperlipidemia   . Hypertension   . Kidney stone   . Diabetic ketoacidosis (HCC) 10/2014    Hospitalized in [place]      Social History   Social History  . Marital Status: Married    Spouse Name: N/A  . Number of Children: N/A  . Years of Education: N/A   Occupational History  . Not on file.   Social History Main Topics  . Smoking status: Never Smoker   . Smokeless tobacco: Never Used  . Alcohol Use: 0.0 oz/week    0 Standard  drinks or equivalent per week     Comment: social  . Drug Use: No  . Sexual Activity: Not on file   Other Topics Concern  . Not on file   Social History Narrative   Moved from [place].   MBA. Works as a Production designer, theatre/television/film.   1 child that lives in [place].   Enjoys bowling, softball.       Past Surgical History  Procedure Laterality Date  . Cardiac catheterization N/A 11/25/2015    Procedure: Left Heart Cath and Coronary Angiography;  Surgeon: Laurey Morale, MD;  Location: [facility] INVASIVE CV LAB;  Service: Cardiovascular;  Laterality: N/A;    Family History  Problem Relation Age of Onset  . Heart disease Father   . Mitral valve prolapse Father   . Lung cancer Maternal Aunt   . Emphysema Maternal Grandmother   . Lung cancer Maternal Grandmother   . Diabetes Paternal Grandmother   . Heart attack Paternal Grandfather     No Known Allergies  Current Outpatient Prescriptions on File Prior to Visit  Medication Sig Dispense Refill  . CINNAMON PO Take 1,000 mg by mouth daily.    . fluticasone (FLONASE) 50 MCG/ACT nasal spray Place 2 sprays into both nostrils daily as needed for allergies or rhinitis. 16 g 6  . glucagon (GLUCAGON EMERGENCY) 1 MG injection Inject 1 mg into the vein once as needed. 2 each prn  . insulin aspart (NOVOLOG FLEXPEN) 100 UNIT/ML FlexPen Inject 4-6 Units into the skin 3 (three) times daily before  meals. 15 mL 2  . LANTUS SOLOSTAR 100 UNIT/ML Solostar Pen Inject 22 Units into the skin at bedtime.   5  . losartan (COZAAR) 50 MG tablet Take 1 tablet (50 mg total) by mouth daily. 90 tablet 1  . Multiple Vitamins-Minerals (MULTIVITAMIN WITH MINERALS) tablet Take 1 tablet by mouth daily.    Marland Kitchen. NOVOFINE 30G X 8 MM MISC   3  . omeprazole (PRILOSEC) 20 MG capsule TAKE ONE CAPSULE BY MOUTH ONCE DAILY 30 capsule 5  . ONE TOUCH ULTRA TEST test strip   2  . simvastatin (ZOCOR) 40 MG tablet Take 1 tablet (40 mg total) by mouth daily. 90 tablet 2   No current facility-administered medications  on file prior to visit.    BP 130/82 mmHg  Pulse 74  Temp(Src) 97.9 F (36.6 C) (Oral)  Ht 5\' 9"  (1.753 m)  Wt 169 lb 6.4 oz (76.839 kg)  BMI 25.00 kg/m2  SpO2 98%    Objective:   Physical Exam  Constitutional: He is oriented to person, place, and time. He appears well-nourished.  HENT:  Right Ear: Tympanic membrane and ear canal normal.  Left Ear: Tympanic membrane and ear canal normal.  Nose: Nose normal. Right sinus exhibits no maxillary sinus tenderness and no frontal sinus tenderness. Left sinus exhibits no maxillary sinus tenderness and no frontal sinus tenderness.  Mouth/Throat: Oropharynx is clear and moist.  Eyes: Conjunctivae and EOM are normal. Pupils are equal, round, and reactive to light.  Neck: Neck supple. Carotid bruit is not present. No thyromegaly present.  Cardiovascular: Normal rate, regular rhythm and normal heart sounds.   Pulmonary/Chest: Effort normal and breath sounds normal. He has no wheezes. He has no rales.  Abdominal: Soft. Bowel sounds are normal. There is no tenderness.  Musculoskeletal: Normal range of motion.  Swelling to fingers of bilateral hands, stiffness, no discomfort. Representative of osteoarthritis.  Neurological: He is alert and oriented to person, place, and time. He has normal reflexes. No cranial nerve deficit.  Skin: Skin is warm and dry.  Psychiatric: He has a normal mood and affect.          Assessment & Plan:

## 2016-05-11 NOTE — Assessment & Plan Note (Signed)
Tetanus over due, provided today. Pneumonia UTD. Colonoscopy due, referral placed. Fair diet overall, discussed reduction in sweets, start exercising. Exam unremarkable. Labs pending. Follow up in 1 year for repeat physical.

## 2016-05-11 NOTE — Assessment & Plan Note (Signed)
Lipids stable in June 2017. Continue statin.

## 2016-05-11 NOTE — Assessment & Plan Note (Signed)
Following with endocrinology with improved A1C last visit. Due for re-evaluation in late July. Denies lows below 70, dizziness, weakness. Discussed importance of reduction in sweets and junk food.

## 2016-05-11 NOTE — Patient Instructions (Signed)
Complete lab work prior to leaving today. I will notify you of your results once received.   Try to consume at least 2 bottles of water daily as this will help with retention of fluid to your hands. You should be consuming 64 ounces daily, but work up to this gradually.  You will be contacted regarding your referral to GI for the colonoscopy.  Please let us know if you have not heard back within one week.   Continue the Meloxicam as needed for pain and inflammation. Please keep me updated regarding your symptoms of pain and swelling.  Follow up in 1 year for repeat physical or sooner if needed.  It was a pleasure to see you today!  Osteoarthritis Osteoarthritis is a disease that causes soreness and inflammation of a joint. It occurs when the cartilage at the affected joint wears down. Cartilage acts as a cushion, covering the ends of bones where they meet to form a joint. Osteoarthritis is the most common form of arthritis. It often occurs in older people. The joints affected most often by this condition include those in the:  Ends of the fingers.  Thumbs.  Neck.  Lower back.  Knees.  Hips. CAUSES  Over time, the cartilage that covers the ends of bones begins to wear away. This causes bone to rub on bone, producing pain and stiffness in the affected joints.  RISK FACTORS Certain factors can increase your chances of having osteoarthritis, including:  Older age.  Excessive body weight.  Overuse of joints.  Previous joint injury. SIGNS AND SYMPTOMS   Pain, swelling, and stiffness in the joint.  Over time, the joint may lose its normal shape.  Small deposits of bone (osteophytes) may grow on the edges of the joint.  Bits of bone or cartilage can break off and float inside the joint space. This may cause more pain and damage. DIAGNOSIS  Your health care provider will do a physical exam and ask about your symptoms. Various tests may be ordered, such as:  X-rays of the  affected joint.  Blood tests to rule out other types of arthritis. Additional tests may be used to diagnose your condition. TREATMENT  Goals of treatment are to control pain and improve joint function. Treatment plans may include:  A prescribed exercise program that allows for rest and joint relief.  A weight control plan.  Pain relief techniques, such as:  Properly applied heat and cold.  Electric pulses delivered to nerve endings under the skin (transcutaneous electrical nerve stimulation [TENS]).  Massage.  Certain nutritional supplements.  Medicines to control pain, such as:  Acetaminophen.  Nonsteroidal anti-inflammatory drugs (NSAIDs), such as naproxen.  Narcotic or central-acting agents, such as tramadol.  Corticosteroids. These can be given orally or as an injection.  Surgery to reposition the bones and relieve pain (osteotomy) or to remove loose pieces of bone and cartilage. Joint replacement may be needed in advanced states of osteoarthritis. HOME CARE INSTRUCTIONS   Take medicines only as directed by your health care provider.  Maintain a healthy weight. Follow your health care provider's instructions for weight control. This may include dietary instructions.  Exercise as directed. Your health care provider can recommend specific types of exercise. These may include:  Strengthening exercises. These are done to strengthen the muscles that support joints affected by arthritis. They can be performed with weights or with exercise bands to add resistance.  Aerobic activities. These are exercises, such as brisk walking or low-impact aerobics, that get  your heart pumping.  Range-of-motion activities. These keep your joints limber.  Balance and agility exercises. These help you maintain daily living skills.  Rest your affected joints as directed by your health care provider.  Keep all follow-up visits as directed by your health care provider. SEEK MEDICAL CARE  IF:   Your skin turns red.  You develop a rash in addition to your joint pain.  You have worsening joint pain.  You have a fever along with joint or muscle aches. SEEK IMMEDIATE MEDICAL CARE IF:  You have a significant loss of weight or appetite.  You have night sweats. FOR MORE INFORMATION   National Institute of Arthritis and Musculoskeletal and Skin Diseases: www.niams.http://www.myers.net/nih.gov  [employer] on Aging: https://walker.com/www.nia.nih.gov  American College of Rheumatology: www.rheumatology.org   This information is not intended to replace advice given to you by your health care provider. Make sure you discuss any questions you have with your health care provider.   Document Released: 10/10/2005 Document Revised: 10/31/2014 Document Reviewed: 06/17/2013 Elsevier Interactive Patient Education [employer]2016 Elsevier Inc.

## 2016-05-11 NOTE — Addendum Note (Signed)
Addended by: Tawnya CrookSAMBATH, Terran Klinke on: 05/11/2016 09:41 AM   Modules accepted: Orders, SmartSet

## 2016-05-18 ENCOUNTER — Encounter: Payer: Self-pay | Admitting: Internal Medicine

## 2016-05-18 ENCOUNTER — Ambulatory Visit (INDEPENDENT_AMBULATORY_CARE_PROVIDER_SITE_OTHER): Payer: BLUE CROSS/BLUE SHIELD | Admitting: Internal Medicine

## 2016-05-18 ENCOUNTER — Encounter: Payer: BLUE CROSS/BLUE SHIELD | Attending: Internal Medicine | Admitting: Nutrition

## 2016-05-18 VITALS — BP 130/82 | HR 66 | Wt 166.8 lb

## 2016-05-18 DIAGNOSIS — E1065 Type 1 diabetes mellitus with hyperglycemia: Secondary | ICD-10-CM | POA: Insufficient documentation

## 2016-05-18 DIAGNOSIS — N182 Chronic kidney disease, stage 2 (mild): Secondary | ICD-10-CM | POA: Insufficient documentation

## 2016-05-18 DIAGNOSIS — IMO0002 Reserved for concepts with insufficient information to code with codable children: Secondary | ICD-10-CM

## 2016-05-18 DIAGNOSIS — E1022 Type 1 diabetes mellitus with diabetic chronic kidney disease: Secondary | ICD-10-CM

## 2016-05-18 DIAGNOSIS — Z713 Dietary counseling and surveillance: Secondary | ICD-10-CM | POA: Diagnosis not present

## 2016-05-18 LAB — POCT GLYCOSYLATED HEMOGLOBIN (HGB A1C): Hemoglobin A1C: 7

## 2016-05-18 MED ORDER — INSULIN PEN NEEDLE 32G X 4 MM MISC: 11 refills | Status: DC

## 2016-05-18 NOTE — Patient Instructions (Signed)
Call me when the pump comes in.

## 2016-05-18 NOTE — Assessment & Plan Note (Signed)
Patient requested information on insulin pumps.  We discussed the advantages and disadvantages of pump therapy, and what is needed to be on a pump.  He was shown the different models and discussed the advantages of each model.  He decided on the Medtronic pump.  Paperwork filled out and Faxed in .

## 2016-05-18 NOTE — Progress Notes (Addendum)
Patient ID: Dakota Sutton, male   DOB: 15-Apr-1966, 50 y.o.   MRN: [id number]  HPI: Dakota Sutton is a 50 y.o.-year-old male, initially referred by his cardiologist, Dr Eden Emms, for management of DM, dx in 2002, insulin-dependent since 2006, uncontrolled, with complications (hypoglycemia, CKD stage 2).  He moved to [facility] from [place] in July 2016. Last visit 3 mo ago.  His fiancee is sick, was hospitalized >> he is stressed.  Last hemoglobin A1c was: Lab Results  Component Value Date   HGBA1C 8.4 02/04/2016   HGBA1C 10.3 (H) 11/12/2015   At last visit, we adjusted his insulin regimen, by adding Novolog and decreasing Lantus:  - Lantus 25 >> 22 units at bedtime - Novolog -  >> ICR 1:15 (minus 1 unit), ISF 50, target 150 - ends up with 6-7  - Sliding scale: 150-200: + 1 unit 201-250: + 2 units 251-300: + 3 units 301-350: + 4 units >350: + 5 units Previously on Metformin, Avandia, Glyset.  He was on Novolog 4-6 units with meals >> stopped Novolog in 2016. He was told Novolog did not work for him (???) He had DKA in 10/2014 (!) >> ICU.   Pt checks his sugars 3 a day and they are better per detailed log - ave: 146: - am: 87, 112-149, 180, 213 >> 65, 80-223, 301 >> 46x1, 72-211, 249 (4 units) >> 43, 160s, 202, 260 - 2h after b'fast: 45, 54, 100-146 >> 60 >> n/c - before lunch: 48, 56, 60, 81-187, 185, 301, 313 >> 58, 61-157, 236 >> 55-176, 229 (6-7 units) >> 80-100 - 2h after lunch: 194-338, 451 >> 63 >> 38 >> n/c - before dinner: 211-378 >> 60-226, 271, 317 >> 59-300 (6-7 units) >> 140-160 - 2h after dinner: 58, 60, 68, 76-354 >> 44-52, 189, 363 >> n/c - bedtime: n/c  >> 49x1 >> 53 >> n/c - nighttime: n/c + lows. Lowest sugar was 48 >> 49 >> 38x1 >> 43; he has hypoglycemia awareness at 50-60.  Highest sugar was 451 >> 300s >> 285.  Glucometer: OneTouch  Pt's meals are - about the same every day, per his report: - Breakfast: Iced coffee, breakfast bowl or eggs, sausage, toast - Lunch: Sandwich,  chips, diet soda, peanut butter bars - Dinner: Meat, vegetable, starch, diet soda - Snacks: 3  He has a very physical work, plays soccer sometimes in the evening.  - + CKD stage 2, last BUN/creatinine:  Lab Results  Component Value Date   BUN 29 (H) 05/11/2016   CREATININE 1.28 05/11/2016  He is on Losartan. - last set of lipids: Lab Results  Component Value Date   CHOL 165 03/24/2016   HDL 65.70 03/24/2016   LDLCALC 91 03/24/2016   TRIG 44.0 03/24/2016   CHOLHDL 3 03/24/2016    He is on Zocor. - last eye exam was in 11/28/2015. No DR.  - + occasional numbness and tingling in his feet.  He also has a history of hypertension and hyperlipidemia.  ROS: Constitutional: no weight gain/loss, no fatigue, no subjective hyperthermia/hypothermia Eyes: no blurry vision, no xerophthalmia ENT: no sore throat, no nodules palpated in throat, no dysphagia/odynophagia, no hoarseness Cardiovascular: no CP/SOB/palpitations/leg swelling Respiratory: no cough/SOB Gastrointestinal: no N/V/D/C Musculoskeletal: no muscle/joint aches Skin: no rashes Neurological: no tremors/numbness/tingling/dizziness  I reviewed pt's medications, allergies, PMH, social hx, family hx, and changes were documented in the history of present illness. Otherwise, unchanged from my initial visit note.  Past Medical History:  Diagnosis Date  .  Chicken pox   . Depression   . Diabetes mellitus without complication (HCC)   . Diabetic ketoacidosis (HCC) 10/2014   Hospitalized in [place]   . Heart murmur   . Hyperlipidemia   . Hypertension   . Kidney stone    Past Surgical History:  Procedure Laterality Date  . CARDIAC CATHETERIZATION N/A 11/25/2015   Procedure: Left Heart Cath and Coronary Angiography;  Surgeon: Laurey Morale, MD;  Location: [facility] INVASIVE CV LAB;  Service: Cardiovascular;  Laterality: N/A;   Social History Main Topics  . Smoking status: Never Smoker   . Smokeless tobacco: Not on file  .  Alcohol Use: 0.0 oz/week    0 Standard drinks or equivalent per week     Comment: social  . Drug Use: No   Social History Narrative   Moved from [place].   MBA. Works as a Production designer, theatre/television/film.   1 child that lives in [place].   Enjoys bowling, softball.      Current Outpatient Prescriptions on File Prior to Visit  Medication Sig Dispense Refill  . CINNAMON PO Take 1,000 mg by mouth daily.    . fluticasone (FLONASE) 50 MCG/ACT nasal spray Place 2 sprays into both nostrils daily as needed for allergies or rhinitis. 16 g 6  . glucagon (GLUCAGON EMERGENCY) 1 MG injection Inject 1 mg into the vein once as needed. 2 each prn  . insulin aspart (NOVOLOG FLEXPEN) 100 UNIT/ML FlexPen Inject 4-6 Units into the skin 3 (three) times daily before meals. 15 mL 2  . LANTUS SOLOSTAR 100 UNIT/ML Solostar Pen Inject 22 Units into the skin at bedtime.   5  . losartan (COZAAR) 50 MG tablet Take 1 tablet (50 mg total) by mouth daily. 90 tablet 1  . meloxicam (MOBIC) 15 MG tablet Take 1 tablet (15 mg total) by mouth daily as needed for pain. 90 tablet 1  . Multiple Vitamins-Minerals (MULTIVITAMIN WITH MINERALS) tablet Take 1 tablet by mouth daily.    Marland Kitchen NOVOFINE 30G X 8 MM MISC   3  . omeprazole (PRILOSEC) 20 MG capsule TAKE ONE CAPSULE BY MOUTH ONCE DAILY 30 capsule 5  . ONE TOUCH ULTRA TEST test strip   2  . simvastatin (ZOCOR) 40 MG tablet Take 1 tablet (40 mg total) by mouth daily. 90 tablet 2   No current facility-administered medications on file prior to visit.    No Known Allergies Family History  Problem Relation Age of Onset  . Heart disease Father   . Mitral valve prolapse Father   . Lung cancer Maternal Aunt   . Emphysema Maternal Grandmother   . Lung cancer Maternal Grandmother   . Diabetes Paternal Grandmother   . Heart attack Paternal Grandfather    PE: BP 130/82   Pulse 66   Wt 166 lb 12.8 oz (75.7 kg)   BMI 24.63 kg/m  Body mass index is 24.63 kg/m.  Wt Readings from Last 3 Encounters:  05/18/16  166 lb 12.8 oz (75.7 kg)  05/11/16 169 lb 6.4 oz (76.8 kg)  04/22/16 165 lb 6.4 oz (75 kg)   Constitutional: Normal weight, in NAD Eyes: PERRLA, EOMI, no exophthalmos ENT: moist mucous membranes, no thyromegaly, no cervical lymphadenopathy Cardiovascular: RRR, No MRG Respiratory: CTA B Gastrointestinal: abdomen soft, NT, ND, BS+ Musculoskeletal: no deformities, strength intact in all 4 Skin: moist, warm, no rashes Neurological: no tremor with outstretched hands, DTR normal in all 4  ASSESSMENT: 1. DM1, insulin-dependent, uncontrolled, with complications  - hypoglycemia -  CKD stage 2  Component     Latest Ref Rng 12/16/2015  C-Peptide     0.80-3.85 ng/mL 0.13 (L)  Glucose, Fasting     65 - 99 mg/dL 960 (H)  Glutamic Acid Decarb Ab     <5 IU/mL <5  Pancreatic Islet Cell Antibody     < 5 JDF Units <5   - We discussed about an insulin pump at a previous visit >> not interested in this. I mentioned the Omnipod, which does not have any tubing and allows him to be active, and he was not opposed to this in theory.   PLAN:  1. Patient with long-standing, uncontrolled diabetes, which we dx'ed as DM1. He was previously on basal insulin only, with insufficient control. We added Novolog and, since he mentioned that he was proficient in counting carbs (refused and nutrition referral for carb counting) >> gave him an ICR (1:15, minus 1 unit). We also added a SSI: target 150, ISF 50.  - he is doing a great job checking his sugars frequently, having approximately the same type of meals and the same number of carbs with his meals. - sugars are higher before dinner and in am >> will increase slightly the boluses with these meals. - we again discussed about pumps or a least a CGM >> he is not sure if his insurance covers them >> he will have an appt with Cristy Folks, to find more about pumps and CGMs and decide for one before checking with insurance if they cover it. - I suggested to:  Patient  Instructions  Please continue: - Lantus 22 units at bedtime  Please change: - Novolog  ICR 1:15 (but subtract 1 unit from the b'fast dose only), ISF 50  Please schedule an appt with Cristy Folks for diabetes education.  Please return in 3 months with your sugar log.   - continue checking sugars at different times of the day - check 3 times a day, rotating checks - advised for yearly eye exams >> he is up-to-date - checked HbA1c today >> 7.0% (excellent improvement) - Return to clinic in 3 mo with sugar log

## 2016-05-18 NOTE — Patient Instructions (Signed)
Please continue: - Lantus 22 units at bedtime  Please change: - Novolog  ICR 1:15 (but subtract 1 unit from the b'fast dose only), ISF 50  Please schedule an appt with Cristy Folks for diabetes education.  Please return in 3 months with your sugar log.

## 2016-06-01 ENCOUNTER — Telehealth: Payer: Self-pay | Admitting: Internal Medicine

## 2016-06-01 ENCOUNTER — Other Ambulatory Visit: Payer: Self-pay

## 2016-06-01 MED ORDER — ONETOUCH ULTRA BLUE VI STRP: ORAL_STRIP | 2 refills | Status: AC

## 2016-06-01 NOTE — Telephone Encounter (Signed)
PT fiance requests PT test strips be [facility] [address] in [place]

## 2016-06-13 ENCOUNTER — Encounter: Payer: Self-pay | Admitting: Primary Care

## 2016-06-16 ENCOUNTER — Encounter: Payer: Self-pay | Admitting: Primary Care

## 2016-06-16 ENCOUNTER — Ambulatory Visit (INDEPENDENT_AMBULATORY_CARE_PROVIDER_SITE_OTHER): Payer: BLUE CROSS/BLUE SHIELD | Admitting: Primary Care

## 2016-06-16 VITALS — BP 118/70 | HR 86 | Temp 97.9°F | Ht 69.0 in | Wt 164.8 lb

## 2016-06-16 DIAGNOSIS — R112 Nausea with vomiting, unspecified: Secondary | ICD-10-CM

## 2016-06-16 MED ORDER — ONDANSETRON 4 MG PO TBDP: 4.0000 mg | ORAL_TABLET | Freq: Three times a day (TID) | ORAL | 0 refills | Status: AC | PRN

## 2016-06-16 NOTE — Progress Notes (Signed)
Pre visit review using our clinic review tool, if applicable. No additional management support is needed unless otherwise documented below in the visit note. 

## 2016-06-16 NOTE — Progress Notes (Signed)
Subjective:    Patient ID: Dakota Sutton, male    DOB: [birth year], 50 y.o.   MRN: [id number]  HPI  Mr. Dakota Sutton is a 50 year old male who presents today with a chief complaint of abdominal pain. His pain is mostly located to the generalized abdomen. His symptoms began Monday evening as he started feeling "off" with a decrease in appetite and fatigue.   Tuesday morning at 2 am he woke up with nausea and began vomiting that persisted with dry heaving until 8 am. He did have a low grade fever over the last several days. He missed work Tuesday and Wednesday this week. He's taken Alka-Seltzer with some improvement.  He had a bowel movement earlier today and is now feeling better overall. He's tolerating liquids and oral food wihtout vomiting. His last episode of vomiting was Tuesday morning. His sugars are stable and running between 115-130's. Denies diarrhea.  He believes he is experience food poisoning as he's felt this way in the past with prior food poisoning from the same restaurant.   Review of Systems  Constitutional: Positive for appetite change and fatigue. Negative for fever.  Gastrointestinal: Positive for abdominal pain. Negative for constipation and diarrhea.  Genitourinary: Negative for difficulty urinating.  Musculoskeletal: Negative for myalgias.  Neurological: Positive for weakness. Negative for dizziness.       Past Medical History:  Diagnosis Date  . Chicken pox   . Depression   . Diabetes mellitus without complication (HCC)   . Diabetic ketoacidosis (HCC) 10/2014   Hospitalized in [place]   . Heart murmur   . Hyperlipidemia   . Hypertension   . Kidney stone      Social History   Social History  . Marital status: Married    Spouse name: N/A  . Number of children: N/A  . Years of education: N/A   Occupational History  . Not on file.   Social History Main Topics  . Smoking status: Never Smoker  . Smokeless tobacco: Never Used  . Alcohol use 0.0 oz/week   Comment: social  . Drug use: No  . Sexual activity: Not on file   Other Topics Concern  . Not on file   Social History Narrative   Moved from [place].   MBA. Works as a Production designer, theatre/television/filmmanager.   1 child that lives in [place].   Enjoys bowling, softball.       Past Surgical History:  Procedure Laterality Date  . CARDIAC CATHETERIZATION N/A 11/25/2015   Procedure: Left Heart Cath and Coronary Angiography;  Surgeon: Laurey Moralealton S McLean, MD;  [facility] LAB;  Service: Cardiovascular;  Laterality: N/A;    Family History  Problem Relation Age of Onset  . Heart disease Father   . Mitral valve prolapse Father   . Lung cancer Maternal Aunt   . Emphysema Maternal Grandmother   . Lung cancer Maternal Grandmother   . Diabetes Paternal Grandmother   . Heart attack Paternal Grandfather     No Known Allergies  Current Outpatient Prescriptions on File Prior to Visit  Medication Sig Dispense Refill  . CINNAMON PO Take 1,000 mg by mouth daily.    . fluticasone (FLONASE) 50 MCG/ACT nasal spray Place 2 sprays into both nostrils daily as needed for allergies or rhinitis. 16 g 6  . glucagon (GLUCAGON EMERGENCY) 1 MG injection Inject 1 mg into the vein once as needed. 2 each prn  . insulin aspart (NOVOLOG FLEXPEN) 100 UNIT/ML FlexPen Inject 4-6 Units into  the skin 3 (three) times daily before meals. 15 mL 2  . Insulin Pen Needle (NOVOFINE PLUS) 32G X 4 MM MISC Use 4x a day 300 each 11  . LANTUS SOLOSTAR 100 UNIT/ML Solostar Pen Inject 22 Units into the skin at bedtime.   5  . losartan (COZAAR) 50 MG tablet Take 1 tablet (50 mg total) by mouth daily. 90 tablet 1  . meloxicam (MOBIC) 15 MG tablet Take 1 tablet (15 mg total) by mouth daily as needed for pain. 90 tablet 1  . Multiple Vitamins-Minerals (MULTIVITAMIN WITH MINERALS) tablet Take 1 tablet by mouth daily.    Marland Kitchen. omeprazole (PRILOSEC) 20 MG capsule TAKE ONE CAPSULE BY MOUTH ONCE DAILY 30 capsule 5  . ONE TOUCH ULTRA TEST test strip Used to check sugar 3  times a day. 100 each 2  . simvastatin (ZOCOR) 40 MG tablet Take 1 tablet (40 mg total) by mouth daily. 90 tablet 2   No current facility-administered medications on file prior to visit.     BP 118/70   Pulse 86   Temp 97.9 F (36.6 C) (Oral)   Ht 5\' 9"  (1.753 m)   Wt 164 lb 12.8 oz (74.8 kg)   SpO2 98%   BMI 24.34 kg/m    Objective:   Physical Exam  Constitutional: He appears well-nourished. He does not appear ill.  Neck: Neck supple.  Cardiovascular: Normal rate and regular rhythm.   Pulmonary/Chest: Effort normal and breath sounds normal.  Abdominal: Soft. Bowel sounds are normal. There is no tenderness. There is no tenderness at McBurney's point and negative Murphy's sign.  Soreness, non tender  Skin: Skin is warm and dry.          Assessment & Plan:  Abdominal Pain:  Generalized abdomen since Tuesday morning. Nausea, vomiting, low grade fevers, weakness. Exam today stable, without abdominal tenderness, vitals stable, does not appear acutely ill or sickly. Suspect 24 hour viral infection that is improving. Rx for Zofran provided for nausea. Discussed to remain hydrated with water, continue to monitor blood sugars, advance diet as tolerated.  Return precautions provided.  Morrie Sheldonlark,Meiko Ives Kendal, NP

## 2016-06-16 NOTE — Patient Instructions (Signed)
Your symptoms represent food poisoning which will pass on its own.  Continue to stay hydrated with water. Advance your diet slowly as tolerated. Avoid aggravating foods such as anything greasy, fried, spicy.  Avoid Imodium if you develop diarrhea.  You may take Zofran as needed for nausea/vomiting. Melt 1 tablet in your mouth every 8 hours as needed.  Please notify me if you develop fevers of 101, increased abdominal pain, you start vomiting again, you start to feel worse.  It was a pleasure to see you today!  Food Poisoning Food poisoning is an illness caused by something you ate or drank. There are over 250 known causes of food poisoning. However, many other causes are unknown.You can be treated even if the exact cause of your food poisoning is not known. In most cases, food poisoning is mild and lasts 1 to 2 days. However, some cases can be serious, especially for people with low immune systems, the elderly, children and infants, and pregnant women. CAUSES  Poor personal hygiene, improper cleaning of storage and preparation areas, and unclean utensils can cause infection or tainting (contamination) of foods. The causes of food poisoning are numerous.Infectious agents, such as viruses, bacteria, or parasites, can cause harm by infecting the intestine and disrupting the absorption of nutrients and water. This can cause diarrhea and lead to dehydration. Viruses are responsible for most of the food poisonings in which an agent is found. Parasites are less likely to cause food poisoning. Toxic agents, such as poisonous mushrooms, marine algae, and pesticides can also cause food poisoning.  Viral causes of food poisoning include:  Norovirus.  Rotavirus.  Hepatitis A.  Bacterial causes of food poisoning include:  Salmonellae.  Campylobacter.  Bacillus cereus.  Escherichia coli (E. coli).  Shigella.  Listeria monocytogenes.  Clostridium botulinum (botulism).  Vibrio  cholerae.  Parasites that can cause food poisoning include:  Giardia.  Cryptosporidium.  Toxoplasma. SYMPTOMS Symptoms may appear several hours or longer after consuming the contaminated food or drink. Symptoms may include:  Nausea.  Vomiting.  Cramping.  Diarrhea.  Fever and chills.  Muscle aches. DIAGNOSIS Your health care provider may be able to diagnose food poisoning from a list of what you have recently eaten and results from lab tests. Diagnostic tests may include an exam of the feces. TREATMENT In most cases, treatment focuses on helping to relieve your symptoms and staying well hydrated. Antibiotic medicines are rarely needed. In severe cases, hospitalization may be required. HOME CARE INSTRUCTIONS   Drink enough water and fluids to keep your urine clear or pale yellow. Drink small amounts of fluids frequently and increase as tolerated.  Ask your health care provider for specific rehydration instructions.  Avoid:  Foods high in sugar.  Alcohol.  Carbonated drinks.  Tobacco.  Juice.  Caffeine drinks.  Extremely hot or cold fluids.  Fatty, greasy foods.  Too much intake of anything at one time.  Dairy products until 24 to 48 hours after diarrhea stops.  You may consume probiotics. Probiotics are active cultures of beneficial bacteria. They may lessen the amount and number of diarrheal stools in adults. Probiotics can be found in yogurt with active cultures and in supplements.  Wash your hands well to avoid spreading the bacteria.  Take medicines only as directed by your health care provider. Do not give your child aspirin because of the association with Reye's syndrome.  Ask your health care provider if you should continue to take your regular prescribed and over-the-counter medicines. PREVENTION  Wash your hands, food preparation surfaces, and utensils thoroughly before and after handling raw foods.  Keep refrigerated foods below 16F  (5C).  Serve hot foods immediately or keep them heated above 116F (60C).  Divide large volumes of food into small portions for rapid cooling in the refrigerator. Hot, bulky foods in the refrigerator can raise the temperature of other foods that have already cooled.  Follow approved canning procedures.  Heat canned foods thoroughly before tasting.  When in doubt, throw it out.  Infants, the elderly, women who are pregnant, and people with compromised immune systems are especially susceptible to food poisoning. These people should never consume unpasteurized cheese, unpasteurized cider, raw fish, raw seafood, or raw meat-type products. SEEK IMMEDIATE MEDICAL CARE IF:   You have difficulty breathing, swallowing, talking, or moving.  You develop blurred vision.  You are unable to keep fluids down.  You faint or nearly faint.  Your eyes turn yellow.  Vomiting or diarrhea develops or becomes persistent.  Abdominal pain develops, increases, or localizes in one small area.  You have a fever.  The diarrhea becomes excessive or contains blood or mucus.  You develop excessive weakness, dizziness, or extreme thirst.  You have no urine for 8 hours. MAKE SURE YOU:   Understand these instructions.  Will watch your condition.  Will get help right away if you are not doing well or get worse.   This information is not intended to replace advice given to you by your health care provider. Make sure you discuss any questions you have with your health care provider.   Document Released: 07/08/2004 Document Revised: 10/31/2014 Document Reviewed: 04/13/2015 Elsevier Interactive Patient Education [employer]

## 2016-06-21 ENCOUNTER — Other Ambulatory Visit: Payer: Self-pay | Admitting: Primary Care

## 2016-06-21 ENCOUNTER — Encounter: Payer: Self-pay | Admitting: Primary Care

## 2016-06-21 DIAGNOSIS — K219 Gastro-esophageal reflux disease without esophagitis: Secondary | ICD-10-CM

## 2016-06-21 MED ORDER — OMEPRAZOLE 20 MG PO CPDR: 20.0000 mg | DELAYED_RELEASE_CAPSULE | Freq: Every day | ORAL | 3 refills | Status: AC

## 2016-06-29 ENCOUNTER — Telehealth: Payer: Self-pay | Admitting: Nutrition

## 2016-06-29 NOTE — Telephone Encounter (Signed)
Patient assures me that he knows how to count carbs.  He was told to stop his Lantus the night before coming in to see me, and to take his meal time insulin for breakfast.  He reported good understanding of this.   He was also encouraged to read the pump book and to insert the battery into the pump and set the date and time.  He agreed to do this.  Appt. Confirmed for 10:30 Tuesday morning.

## 2016-07-05 ENCOUNTER — Encounter: Payer: BLUE CROSS/BLUE SHIELD | Attending: Internal Medicine | Admitting: Nutrition

## 2016-07-05 ENCOUNTER — Other Ambulatory Visit: Payer: Self-pay

## 2016-07-05 DIAGNOSIS — IMO0002 Reserved for concepts with insufficient information to code with codable children: Secondary | ICD-10-CM

## 2016-07-05 DIAGNOSIS — E1022 Type 1 diabetes mellitus with diabetic chronic kidney disease: Secondary | ICD-10-CM | POA: Insufficient documentation

## 2016-07-05 DIAGNOSIS — N182 Chronic kidney disease, stage 2 (mild): Secondary | ICD-10-CM | POA: Insufficient documentation

## 2016-07-05 DIAGNOSIS — Z713 Dietary counseling and surveillance: Secondary | ICD-10-CM | POA: Insufficient documentation

## 2016-07-05 DIAGNOSIS — E1065 Type 1 diabetes mellitus with hyperglycemia: Secondary | ICD-10-CM | POA: Insufficient documentation

## 2016-07-05 MED ORDER — INSULIN ASPART 100 UNIT/ML ~~LOC~~ SOLN: SUBCUTANEOUS | 3 refills | Status: AC

## 2016-07-05 NOTE — Assessment & Plan Note (Signed)
Patient and his wife were trained on the Medtronic 670 G.  Settings were put into the pump by the patient with some help from me:  Basal rate:  0.7u/hr, I/C; 15, ISF: 30, target: 100, timing 4 hours.   We reviewed how to give a bolus, how to fil/change a resorvour, rotating insertion sites, and alerts and alarms.  See check list for all topics discussed.  He can not come in tomorrow,  He filled a resorvour with Novolog insulin, and isserted a  6mm Quick set infusion set into his upper left abdominal quadrant.  He re demonstrated correctly how to test blood sugars using the paired Bayer meter, and how to give a bolus, and had no final questions.   I will call him tonight, and he will will call in his blood sugar readings to me tomorrow afternoon.  He was told to call today, before 5PM,  if blood sugars drops, or remain over 200.  He agreed to do this. He was given a sheet to record the blood sugars and was told to test ac, 2hr. Pc, HS, and 3AM.  Sensors are on back order, so he will call me when they come in to be trained on them. He will come back on Thursday morning,

## 2016-07-05 NOTE — Patient Instructions (Signed)
Test blood sugars before meals, 2hr. After meals, bedtime and 3 AM.   Call Wednesday afternoon with readings. Call sooner if questions.

## 2016-07-07 ENCOUNTER — Telehealth: Payer: Self-pay | Admitting: Nutrition

## 2016-07-07 ENCOUNTER — Encounter: Payer: BLUE CROSS/BLUE SHIELD | Admitting: Nutrition

## 2016-07-07 DIAGNOSIS — N182 Chronic kidney disease, stage 2 (mild): Principal | ICD-10-CM

## 2016-07-07 DIAGNOSIS — E1022 Type 1 diabetes mellitus with diabetic chronic kidney disease: Secondary | ICD-10-CM

## 2016-07-07 DIAGNOSIS — IMO0002 Reserved for concepts with insufficient information to code with codable children: Secondary | ICD-10-CM

## 2016-07-07 DIAGNOSIS — E1065 Type 1 diabetes mellitus with hyperglycemia: Principal | ICD-10-CM

## 2016-07-07 NOTE — Telephone Encounter (Signed)
Dakota Sutton, I agree, this is dangerous! However, he cannot be forced to leave... I hope he does not develop DKA.

## 2016-07-07 NOTE — Telephone Encounter (Signed)
Pt. Called saying that his blood sugar before lunch was 86, and now it is 342   2 hours after lunch.  I had him retest his blood sugar,and it was 356.  He tried to do a correction bolus, and the pump said he had an occlusion.  He was very upset, saying he could not go home, he was alone at work.  He said he did not have another infusion set, and no syringe to give any Novolog insulin.  I suggested he call his wife to bring him his Novolog pen.  He said she was working.   I suggested he go home, or to a drug store to get a syringe and draw up insulin from the Novolog vial, and he refused to leave work.  I stressed the immediate need to do this.  He got very upset and said that he can not use this pump, if these are the problems he will be having.  He is going to wait until he gets home at [facility] and take his Lantus and Novolog per injections.   He was told to call the ambulance if he gets nauseated or starts vomiting.  I stressed that his blood sugars will rise rapidly and he assured me that he will be fine until he gets home at [facility]  He agreed to call me at that time.
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
## 2016-07-07 NOTE — Patient Instructions (Signed)
Test blood sugars before and 2hr. After meals and call me the results on Friday or Saturday, if no more lows. Read over manual

## 2016-07-12 NOTE — Telephone Encounter (Signed)
I phoned him at home at [facility] on 9/14, and he reported that his blood sugar was just 229, and continues to think this pump "is not going to work for him".  I stressed the need to take Lantus and continue on his dose a before starting the pump.  He agreed to do this.

## 2016-07-30 ENCOUNTER — Emergency Department (HOSPITAL_COMMUNITY)
Admission: EM | Admit: 2016-07-30 | Discharge: 2016-07-30 | Disposition: A | Discharge status: Home / Self Care | Payer: BLUE CROSS/BLUE SHIELD | Attending: Emergency Medicine | Admitting: Emergency Medicine

## 2016-07-30 ENCOUNTER — Emergency Department (HOSPITAL_COMMUNITY): Payer: BLUE CROSS/BLUE SHIELD

## 2016-07-30 ENCOUNTER — Encounter (HOSPITAL_COMMUNITY): Payer: Self-pay | Admitting: *Deleted

## 2016-07-30 DIAGNOSIS — I10 Essential (primary) hypertension: Secondary | ICD-10-CM | POA: Diagnosis not present

## 2016-07-30 DIAGNOSIS — N201 Calculus of ureter: Secondary | ICD-10-CM

## 2016-07-30 DIAGNOSIS — E119 Type 2 diabetes mellitus without complications: Secondary | ICD-10-CM | POA: Diagnosis not present

## 2016-07-30 DIAGNOSIS — N132 Hydronephrosis with renal and ureteral calculous obstruction: Secondary | ICD-10-CM | POA: Diagnosis not present

## 2016-07-30 DIAGNOSIS — R109 Unspecified abdominal pain: Secondary | ICD-10-CM | POA: Diagnosis present

## 2016-07-30 DIAGNOSIS — Z794 Long term (current) use of insulin: Secondary | ICD-10-CM | POA: Insufficient documentation

## 2016-07-30 LAB — URINE MICROSCOPIC-ADD ON: Bacteria, UA: NONE SEEN

## 2016-07-30 LAB — URINALYSIS, ROUTINE W REFLEX MICROSCOPIC
Bilirubin Urine: NEGATIVE
Glucose, UA: 500 mg/dL — AB
Ketones, ur: 15 mg/dL — AB
Leukocytes, UA: NEGATIVE
Nitrite: NEGATIVE
Protein, ur: NEGATIVE mg/dL
Specific Gravity, Urine: 1.015 (ref 1.005–1.030)
pH: 6 (ref 5.0–8.0)

## 2016-07-30 LAB — BASIC METABOLIC PANEL
Anion gap: 7 (ref 5–15)
BUN: 24 mg/dL — ABNORMAL HIGH (ref 6–20)
CO2: 23 mmol/L (ref 22–32)
Calcium: 9.1 mg/dL (ref 8.9–10.3)
Chloride: 108 mmol/L (ref 101–111)
Creatinine, Ser: 1.46 mg/dL — ABNORMAL HIGH (ref 0.61–1.24)
GFR calc Af Amer: >60 mL/min (ref >60)
GFR calc non Af Amer: 54 mL/min — ABNORMAL LOW (ref >60)
Glucose, Bld: 215 mg/dL — ABNORMAL HIGH (ref 65–99)
Potassium: 3.9 mmol/L (ref 3.5–5.1)
Sodium: 138 mmol/L (ref 135–145)

## 2016-07-30 LAB — CBC WITH DIFFERENTIAL/PLATELET
Basophils Absolute: 0 10*3/uL (ref 0.0–0.1)
Basophils Relative: 0 %
Eosinophils Absolute: 0.1 10*3/uL (ref 0.0–0.7)
Eosinophils Relative: 1 %
HCT: 43.4 % (ref 39.0–52.0)
Hemoglobin: 14.6 g/dL (ref 13.0–17.0)
Lymphocytes Relative: 14 %
Lymphs Abs: 1.5 10*3/uL (ref 0.7–4.0)
MCH: 30.9 pg (ref 26.0–34.0)
MCHC: 33.6 g/dL (ref 30.0–36.0)
MCV: 91.9 fL (ref 78.0–100.0)
Monocytes Absolute: 0.6 10*3/uL (ref 0.1–1.0)
Monocytes Relative: 5 %
Neutro Abs: 8.6 10*3/uL — ABNORMAL HIGH (ref 1.7–7.7)
Neutrophils Relative %: 80 %
Platelets: 255 10*3/uL (ref 150–400)
RBC: 4.72 MIL/uL (ref 4.22–5.81)
RDW: 13.2 % (ref 11.5–15.5)
WBC: 10.8 10*3/uL — ABNORMAL HIGH (ref 4.0–10.5)

## 2016-07-30 MED ORDER — ONDANSETRON HCL 4 MG PO TABS: 4.0000 mg | ORAL_TABLET | Freq: Four times a day (QID) | ORAL | 0 refills | Status: AC

## 2016-07-30 MED ORDER — MORPHINE SULFATE (PF) 4 MG/ML IV SOLN
4.0000 mg | Freq: Once | INTRAVENOUS | Status: AC
Start: 2016-07-30 — End: 2016-07-30
  Administered 2016-07-30: 4 mg via INTRAVENOUS
  Filled 2016-07-30: qty 1

## 2016-07-30 MED ORDER — OXYCODONE-ACETAMINOPHEN 5-325 MG PO TABS: 1.0000 | ORAL_TABLET | ORAL | 0 refills | Status: DC | PRN

## 2016-07-30 MED ORDER — TAMSULOSIN HCL 0.4 MG PO CAPS: 0.4000 mg | ORAL_CAPSULE | Freq: Every day | ORAL | 0 refills | Status: AC

## 2016-07-30 NOTE — ED Triage Notes (Signed)
Pt reports onset last night of right side back pain and nausea. Hx of kidney stones.

## 2016-07-30 NOTE — ED Provider Notes (Signed)
[facility]-EMERGENCY DEPT Provider Note   CSN: [id number] Arrival date & time: 07/30/16  4098     History   Chief Complaint Chief Complaint  Patient presents with  . Flank Pain    HPI Dakota Sutton is a 50 y.o. male with history of kidney stones, diabetes who presents with right flank pain that began last evening after dinner. Patient describes his pain as a crampy, sharp pain in his right lower back and radiates to his right lower quadrant. He has had associated nausea, but no vomiting. He has also had associated diaphoresis, but no fevers. He states that the pain has him doubling over. It is worse with movement. He has taken Zofran which resolved his nausea, but did not help with his pain. Patient denies any chest pain, shortness of breath, urinary symptoms. Patient has been able to urinate normally.  HPI  Past Medical History:  Diagnosis Date  . Chicken pox   . Depression   . Diabetes mellitus without complication (HCC)   . Diabetic ketoacidosis (HCC) 10/2014   Hospitalized in [place]   . Heart murmur   . Hyperlipidemia   . Hypertension   . Kidney stone     Patient Active Problem List   Diagnosis Date Noted  . Preventative health care 05/11/2016  . 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 Essential hypertension 08/12/2015  . Hyperlipidemia LDL goal <100 08/12/2015    Past Surgical History:  Procedure Laterality Date  . CARDIAC CATHETERIZATION N/A 11/25/2015   Procedure: Left Heart Cath and Coronary Angiography;  Surgeon: Laurey Morale, MD;  Location: [facility] INVASIVE CV LAB;  Service: Cardiovascular;  Laterality: N/A;       Home Medications    Prior to Admission medications   Medication Sig Start Date End Date Taking? Authorizing Provider  CINNAMON PO Take 1,000 mg by mouth daily.   Yes Historical Provider, MD  fluticasone (FLONASE) 50 MCG/ACT nasal spray Place 2 sprays into both nostrils daily as needed for  allergies or rhinitis. 10/27/15  Yes Doreene Nest, NP  glucagon (GLUCAGON EMERGENCY) 1 MG injection Inject 1 mg into the vein once as needed. 12/16/15  Yes Carlus Pavlov, MD  insulin aspart (NOVOLOG) 100 UNIT/ML injection Use 42 units a day via pump. Patient taking differently: Inject 4-20 Units into the skin daily. Patient is on a sliding scale 07/05/16  Yes Carlus Pavlov, MD  LANTUS SOLOSTAR 100 UNIT/ML Solostar Pen Inject 22 Units into the skin at bedtime.  03/22/16  Yes Historical Provider, MD  losartan (COZAAR) 50 MG tablet Take 1 tablet (50 mg total) by mouth daily. 02/15/16  Yes Doreene Nest, NP  meloxicam (MOBIC) 15 MG tablet Take 1 tablet (15 mg total) by mouth daily as needed for pain. 05/11/16  Yes Doreene Nest, NP  Multiple Vitamins-Minerals (MULTIVITAMIN WITH MINERALS) tablet Take 1 tablet by mouth daily.   Yes Historical Provider, MD  omeprazole (PRILOSEC) 20 MG capsule Take 1 capsule (20 mg total) by mouth daily. 06/21/16  Yes Doreene Nest, NP  ondansetron (ZOFRAN ODT) 4 MG disintegrating tablet Take 1 tablet (4 mg total) by mouth every 8 (eight) hours as needed for nausea or vomiting. 06/16/16  Yes Doreene Nest, NP  simvastatin (ZOCOR) 40 MG tablet Take 1 tablet (40 mg total) by mouth daily. 11/16/15  Yes Doreene Nest, NP  Insulin Pen Needle (NOVOFINE PLUS) 32G X 4 MM MISC Use 4x a day 05/18/16  Carlus Pavlov, MD  ondansetron (ZOFRAN) 4 MG tablet Take 1 tablet (4 mg total) by mouth every 6 (six) hours. 07/30/16   Trannie Bardales M Deaven Urwin, PA-C  ONE TOUCH ULTRA TEST test strip Used to check sugar 3 times a day. 06/01/16   Carlus Pavlov, MD  oxyCODONE-acetaminophen (PERCOCET/ROXICET) 5-325 MG tablet Take 1-2 tablets by mouth every 4 (four) hours as needed for severe pain. 07/30/16   Emi Holes, PA-C  tamsulosin (FLOMAX) 0.4 MG CAPS capsule Take 1 capsule (0.4 mg total) by mouth daily after supper. 07/30/16   Emi Holes, PA-C    Family History Family  History  Problem Relation Age of Onset  . Heart disease Father   . Mitral valve prolapse Father   . Lung cancer Maternal Aunt   . Emphysema Maternal Grandmother   . Lung cancer Maternal Grandmother   . Diabetes Paternal Grandmother   . Heart attack Paternal Grandfather     Social History Social History  Substance Use Topics  . Smoking status: Never Smoker  . Smokeless tobacco: Never Used  . Alcohol use 0.0 oz/week     Comment: social     Allergies   Review of patient's allergies indicates no known allergies.   Review of Systems Review of Systems  Constitutional: Negative for chills and fever.  HENT: Negative for facial swelling and sore throat.   Respiratory: Negative for shortness of breath.   Cardiovascular: Negative for chest pain.  Gastrointestinal: Positive for abdominal pain (mild RLQ) and nausea. Negative for vomiting.  Genitourinary: Positive for flank pain (R flank). Negative for dysuria, frequency and urgency.  Musculoskeletal: Negative for back pain.  Skin: Negative for rash and wound.  Neurological: Negative for headaches.  Psychiatric/Behavioral: The patient is not nervous/anxious.      Physical Exam Updated Vital Signs BP 130/81   Pulse 77   Temp 97.4 F (36.3 C) (Oral)   Resp 16   SpO2 97%   Physical Exam  Constitutional: He appears well-developed and well-nourished. No distress.  HENT:  Head: Normocephalic and atraumatic.  Mouth/Throat: Oropharynx is clear and moist. No oropharyngeal exudate.  Eyes: Conjunctivae are normal. Pupils are equal, round, and reactive to light. Right eye exhibits no discharge. Left eye exhibits no discharge. No scleral icterus.  Neck: Normal range of motion. Neck supple. No thyromegaly present.  Cardiovascular: Normal rate, regular rhythm, normal heart sounds and intact distal pulses.  Exam reveals no gallop and no friction rub.   No murmur heard. Pulmonary/Chest: Effort normal and breath sounds normal. No stridor.  No respiratory distress. He has no wheezes. He has no rales.  Abdominal: Soft. Bowel sounds are normal. He exhibits no distension. There is tenderness. There is no rebound and no guarding. CVA tenderness: R CVA.    Mild right lower quadrant tenderness, more significant right flank and lumbar tenderness  Musculoskeletal: He exhibits no edema.  Lymphadenopathy:    He has no cervical adenopathy.  Neurological: He is alert. Coordination normal.  Skin: Skin is warm and dry. No rash noted. He is not diaphoretic. No pallor.  Psychiatric: He has a normal mood and affect.  Nursing note and vitals reviewed.    ED Treatments / Results  Labs (all labs ordered are listed, but only abnormal results are displayed) Labs Reviewed  BASIC METABOLIC PANEL - Abnormal; Notable for the following:       Result Value   Glucose, Bld 215 (*)    BUN 24 (*)    Creatinine, Ser  1.46 (*)    GFR calc non Af Amer 54 (*)    All other components within normal limits  CBC WITH DIFFERENTIAL/PLATELET - Abnormal; Notable for the following:    WBC 10.8 (*)    Neutro Abs 8.6 (*)    All other components within normal limits  URINALYSIS, ROUTINE W REFLEX MICROSCOPIC (NOT AT [facility]) - Abnormal; Notable for the following:    Glucose, UA 500 (*)    Hgb urine dipstick LARGE (*)    Ketones, ur 15 (*)    All other components within normal limits  URINE MICROSCOPIC-ADD ON - Abnormal; Notable for the following:    Squamous Epithelial / LPF 0-5 (*)    All other components within normal limits    EKG  EKG Interpretation None       Radiology Ct Abdomen Pelvis Wo Contrast  Result Date: 07/30/2016 CLINICAL DATA:  Right flank and right lower quadrant pain EXAM: CT ABDOMEN AND PELVIS WITHOUT CONTRAST TECHNIQUE: Multidetector CT imaging of the abdomen and pelvis was performed following the standard protocol without oral or intravenous contrast material administration. COMPARISON:  None. FINDINGS: Lower chest: There is slight  scarring in the lung bases. Lung bases otherwise are clear. Hepatobiliary: There is a 4 mm probable cyst in the dome of liver on the right. No other focal liver lesions are evident on this noncontrast enhanced study. Gallbladder wall is not appreciably thickened. There is no biliary duct dilatation. Pancreas: There is no pancreatic mass or inflammatory focus. Spleen: No splenic lesions are evident. There is a small splenule medial to the spleen. Adrenals/Urinary Tract: Adrenals appear normal bilaterally. There is no renal mass on either side. Right kidney is edematous with mild hydronephrosis. There is no hydronephrosis on the left. There is no intrarenal calculus on the right. On the left, there is mild nephrocalcinosis. There are several 1 mm calculi in the upper pole region. There is a calculus in the right ureter measuring 4 x 4 mm at the level of L4. There is also a 2 mm calculus at the right ureterovesical junction. No ureteral calculi are identified on the left. Urinary bladder is midline with wall thickness within normal limits. Stomach/Bowel: There are occasional sigmoid diverticula without diverticulitis. There is no bowel wall or mesenteric thickening. No bowel obstruction. No free air or portal venous air. There is moderate stool throughout the colon. Vascular/Lymphatic: There is no abdominal aortic aneurysm. No vascular lesions are evident on this study. There is no appreciable adenopathy in the abdomen or pelvis. Reproductive: There are a few prostatic calculi on the left inferiorly. Prostate and seminal vesicles are normal in size and contour. There is no pelvic mass or pelvic fluid collection. Other: Appendix appears normal. There is no ascites or abscess in the abdomen or pelvis. Musculoskeletal: There are no blastic or lytic bone lesions. There is no intramuscular lesion. Areas of mild subcutaneous thickening in the anterior pelvic wall potentially could represent residua of previous trauma.  IMPRESSION: Hydronephrosis right kidney. Right kidney is subtly edematous. There is a 4 mm calculus in the right ureter at the level of L4. A second calculus measuring 2 mm is noted at the right ureterovesical junction. There is no hydronephrosis or ureteral calculus on the left. There are small nonobstructing calculi in the left kidney with mild nephrocalcinosis on the left. No bowel obstruction. No abscess. Appendix appears normal. There are several prostatic calculi on the left inferiorly. Electronically Signed   By: Bretta Bang III M.D.  On: 07/30/2016 10:24    Procedures Procedures (including critical care time)  Medications Ordered in ED Medications  morphine 4 MG/ML injection 4 mg (4 mg Intravenous Given 07/30/16 0909)     Initial Impression / Assessment and Plan / ED Course  I have reviewed the triage vital signs and the nursing notes.  Pertinent labs & imaging results that were available during my care of the patient were reviewed by me and considered in my medical decision making (see chart for details).  Clinical Course    Pt has been diagnosed with a Kidney Stone via CT, normal appearing appendix. UA shows 500 glucose, large hematuria, 15 ketones, no bacteria. BMP shows stable kidney function with BUN 24 and creatinine 1.46; glucose 2:15. CBC shows WBC 10.8. Patient's pain controlled in ED with morphine. Patient discharged home with Flomax, Percocet, Zofran with follow-up to urology within 1 week. Return precautions discussed. Patient understands and agrees with plan. Patient vitals stable throughout ED course and discharged in satisfactory condition.   Final Clinical Impressions(s) / ED Diagnoses   Final diagnoses:  Ureterolithiasis    New Prescriptions New Prescriptions   ONDANSETRON (ZOFRAN) 4 MG TABLET    Take 1 tablet (4 mg total) by mouth every 6 (six) hours.   OXYCODONE-ACETAMINOPHEN (PERCOCET/ROXICET) 5-325 MG TABLET    Take 1-2 tablets by mouth every 4  (four) hours as needed for severe pain.   TAMSULOSIN (FLOMAX) 0.4 MG CAPS CAPSULE    Take 1 capsule (0.4 mg total) by mouth daily after supper.     Emi Holeslexandra M Meeah Totino, PA-C 07/30/16 1136    Raeford RazorStephen Kohut, MD 08/02/16 [phone]

## 2016-07-30 NOTE — Discharge Instructions (Signed)
Medications: Flomax, Percocet, Zofran  Treatment: Take Flomax once daily after dinner, before bed. Take 1-2 Percocet every 4-6 hours as needed for your pain. Take Zofran every 6 hours as needed for nausea and vomiting. Patient to drink plenty of water. Strain your urine with each urination in hopes of catching your stone.  Follow-up: Please follow-up with urology within 1 week. Patient emergency department if you develop any new or worsening symptoms.

## 2016-07-30 NOTE — ED Notes (Signed)
Pt returned from CT. Placed back on monitor.  

## 2016-08-01 ENCOUNTER — Encounter: Payer: Self-pay | Admitting: Primary Care

## 2016-08-01 ENCOUNTER — Ambulatory Visit (INDEPENDENT_AMBULATORY_CARE_PROVIDER_SITE_OTHER): Payer: BLUE CROSS/BLUE SHIELD | Admitting: Primary Care

## 2016-08-01 VITALS — BP 152/96 | HR 103 | Temp 97.2°F | Ht 69.0 in | Wt 170.1 lb

## 2016-08-01 DIAGNOSIS — N200 Calculus of kidney: Secondary | ICD-10-CM

## 2016-08-01 MED ORDER — OXYCODONE-ACETAMINOPHEN 5-325 MG PO TABS: 1.0000 | ORAL_TABLET | Freq: Four times a day (QID) | ORAL | 0 refills | Status: AC | PRN

## 2016-08-01 MED ORDER — PROMETHAZINE HCL 12.5 MG PO TABS: 12.5000 mg | ORAL_TABLET | Freq: Three times a day (TID) | ORAL | 0 refills | Status: AC | PRN

## 2016-08-01 NOTE — Patient Instructions (Addendum)
Continue Flomax to help release the stone.  Continue Percocet. Take 1-2 tablets by mouth every 6 hours as needed for moderate to severe pain.  You may try Phenergan 12.5 mg tablets for nausea. Take 1-2 tablets by mouth every 8 hours as needed for nausea. You may take Zofran during the day.  Stop by the front desk and speak with either Shirlee LimerickMarion or Revonda StandardAllison regarding your referral to Urology. She will get you set up.  Continue to stay hydrated with water.  It was a pleasure to see you today!

## 2016-08-01 NOTE — Progress Notes (Signed)
Pre visit review using our clinic review tool, if applicable. No additional management support is needed unless otherwise documented below in the visit note. 

## 2016-08-01 NOTE — Progress Notes (Signed)
Subjective:    Patient ID: Dakota Sutton, male    DOB: 1966/09/08, 50 y.o.   MRN: [id number]  HPI  Dakota Sutton is a 50 year old male with a history of type 2 diabetes and kidney stones who presents today for hospital follow up.   He presented to the [facility] on 07/30/16 with a chief complaint of right sided flank pain. His pain began the night before to the right flank with radiation to his RLQ.   During his stay in the ED he underwent UA (glucose, hematuria, ketones, no bacteria), BMP (stable renal function), and CBC (mostly unremarkable). He also underwent CT of his abdomen and pelvis which revealed a normal appendix, and hydronephrosis of the right kidney with 4 mm calculus to the right ureter.   He underwent treatment with Morphine in the ED with improvement. He was considered stable for outpatient management and sent home with prescriptions for Percocet, Zofran, and Flomax. He was also strongly encouraged to follow up with Urology urgently.  Since his discharge home he's continued to experience pain and discomfort, but is overall feeling improved. He's taken Percocet sparingly as he only received 8 tablets from the ED, Flomax, and Zofran. The Zofran has not helped much to reduce his nausea.  He's feeling weak and tired. Denies fevers, chills. He has not set up evaluation for the Urologist as he needs a referral.  Review of Systems  Constitutional: Positive for fatigue. Negative for chills and fever.  Gastrointestinal: Positive for abdominal pain, nausea and vomiting. Negative for diarrhea.  Genitourinary: Positive for flank pain. Negative for dysuria and frequency.       Past Medical History:  Diagnosis Date  . Chicken pox   . Depression   . Diabetes mellitus without complication (HCC)   . Diabetic ketoacidosis (HCC) 10/2014   Hospitalized in [place]   . Heart murmur   . Hyperlipidemia   . Hypertension   . Kidney stone      Social History   Social History  . Marital status:  Married    Spouse name: N/A  . Number of children: N/A  . Years of education: N/A   Occupational History  . Not on file.   Social History Main Topics  . Smoking status: Never Smoker  . Smokeless tobacco: Never Used  . Alcohol use 0.0 oz/week     Comment: social  . Drug use: No  . Sexual activity: Not on file   Other Topics Concern  . Not on file   Social History Narrative   Moved from [place].   MBA. Works as a Production designer, theatre/television/film.   1 child that lives in [place].   Enjoys bowling, softball.       Past Surgical History:  Procedure Laterality Date  . CARDIAC CATHETERIZATION N/A 11/25/2015   Procedure: Left Heart Cath and Coronary Angiography;  Surgeon: Laurey Morale, MD;  Location: [facility] INVASIVE CV LAB;  Service: Cardiovascular;  Laterality: N/A;    Family History  Problem Relation Age of Onset  . Heart disease Father   . Mitral valve prolapse Father   . Lung cancer Maternal Aunt   . Emphysema Maternal Grandmother   . Lung cancer Maternal Grandmother   . Diabetes Paternal Grandmother   . Heart attack Paternal Grandfather     No Known Allergies  Current Outpatient Prescriptions on File Prior to Visit  Medication Sig Dispense Refill  . CINNAMON PO Take 1,000 mg by mouth daily.    . fluticasone (FLONASE)  50 MCG/ACT nasal spray Place 2 sprays into both nostrils daily as needed for allergies or rhinitis. 16 g 6  . glucagon (GLUCAGON EMERGENCY) 1 MG injection Inject 1 mg into the vein once as needed. 2 each prn  . insulin aspart (NOVOLOG) 100 UNIT/ML injection Use 42 units a day via pump. (Patient taking differently: Inject 4-20 Units into the skin daily. Patient is on a sliding scale) 60 mL 3  . Insulin Pen Needle (NOVOFINE PLUS) 32G X 4 MM MISC Use 4x a day 300 each 11  . LANTUS SOLOSTAR 100 UNIT/ML Solostar Pen Inject 22 Units into the skin at bedtime.   5  . losartan (COZAAR) 50 MG tablet Take 1 tablet (50 mg total) by mouth daily. 90 tablet 1  . meloxicam (MOBIC) 15 MG tablet Take 1  tablet (15 mg total) by mouth daily as needed for pain. 90 tablet 1  . Multiple Vitamins-Minerals (MULTIVITAMIN WITH MINERALS) tablet Take 1 tablet by mouth daily.    Marland Kitchen. omeprazole (PRILOSEC) 20 MG capsule Take 1 capsule (20 mg total) by mouth daily. 90 capsule 3  . ondansetron (ZOFRAN ODT) 4 MG disintegrating tablet Take 1 tablet (4 mg total) by mouth every 8 (eight) hours as needed for nausea or vomiting. 20 tablet 0  . ondansetron (ZOFRAN) 4 MG tablet Take 1 tablet (4 mg total) by mouth every 6 (six) hours. 12 tablet 0  . ONE TOUCH ULTRA TEST test strip Used to check sugar 3 times a day. 100 each 2  . simvastatin (ZOCOR) 40 MG tablet Take 1 tablet (40 mg total) by mouth daily. 90 tablet 2  . tamsulosin (FLOMAX) 0.4 MG CAPS capsule Take 1 capsule (0.4 mg total) by mouth daily after supper. 7 capsule 0   No current facility-administered medications on file prior to visit.     BP (!) 152/96   Pulse (!) 103   Temp 97.2 F (36.2 C) (Oral)   Ht 5\' 9"  (1.753 m)   Wt 170 lb 1.9 oz (77.2 kg)   SpO2 97%   BMI 25.12 kg/m    Objective:   Physical Exam  Constitutional: He appears well-nourished. No distress.  Appears uncomfortable  Neck: Neck supple.  Cardiovascular: Normal rate and regular rhythm.   Pulmonary/Chest: Effort normal and breath sounds normal.  Abdominal: There is tenderness in the right lower quadrant and epigastric area. There is CVA tenderness.  Skin: Skin is warm and dry.          Assessment & Plan:  Hospital Follow Up:  [facility] with right flank and lower quadrant pain. Diagnosed with 4 mm ureteral stone to right side. Treated and sent home with percocet, flomax, zofran. Exam today with moderate CVA and RLQ tenderness. Some epigastric tenderness likely from vomiting. Appears stable but uncomfortable. Out of percocet, refill provided. Switch to Phenergan. Continue Flomax. Urgent referral placed to Urology for further evaluation given  history. Stable at this point for outpatient management. Return precautions provided.  All hospital notes, imaging, labs reviewed. Morrie Sheldonlark,Katherine Kendal, NP

## 2016-08-03 ENCOUNTER — Other Ambulatory Visit: Payer: Self-pay | Admitting: Urology

## 2016-08-04 ENCOUNTER — Encounter (HOSPITAL_COMMUNITY): Payer: Self-pay | Admitting: *Deleted

## 2016-08-08 ENCOUNTER — Ambulatory Visit (HOSPITAL_COMMUNITY)
Admission: RE | Admit: 2016-08-08 | Discharge: 2016-08-08 | Disposition: A | Discharge status: Home / Self Care | Payer: BLUE CROSS/BLUE SHIELD | Source: Ambulatory Visit | Attending: Urology | Admitting: Urology

## 2016-08-08 ENCOUNTER — Encounter (HOSPITAL_COMMUNITY)
Admission: RE | Disposition: A | Discharge status: Home / Self Care | Payer: Self-pay | Source: Ambulatory Visit | Attending: Urology

## 2016-08-08 ENCOUNTER — Encounter (HOSPITAL_COMMUNITY): Payer: Self-pay | Admitting: *Deleted

## 2016-08-08 ENCOUNTER — Ambulatory Visit (HOSPITAL_COMMUNITY): Payer: BLUE CROSS/BLUE SHIELD

## 2016-08-08 DIAGNOSIS — N29 Other disorders of kidney and ureter in diseases classified elsewhere: Secondary | ICD-10-CM | POA: Diagnosis not present

## 2016-08-08 DIAGNOSIS — Z79899 Other long term (current) drug therapy: Secondary | ICD-10-CM | POA: Insufficient documentation

## 2016-08-08 DIAGNOSIS — N202 Calculus of kidney with calculus of ureter: Secondary | ICD-10-CM | POA: Diagnosis present

## 2016-08-08 DIAGNOSIS — R011 Cardiac murmur, unspecified: Secondary | ICD-10-CM | POA: Diagnosis not present

## 2016-08-08 DIAGNOSIS — E119 Type 2 diabetes mellitus without complications: Secondary | ICD-10-CM | POA: Insufficient documentation

## 2016-08-08 DIAGNOSIS — Z7901 Long term (current) use of anticoagulants: Secondary | ICD-10-CM | POA: Diagnosis not present

## 2016-08-08 DIAGNOSIS — N201 Calculus of ureter: Secondary | ICD-10-CM

## 2016-08-08 DIAGNOSIS — I10 Essential (primary) hypertension: Secondary | ICD-10-CM | POA: Diagnosis not present

## 2016-08-08 DIAGNOSIS — Z794 Long term (current) use of insulin: Secondary | ICD-10-CM | POA: Diagnosis not present

## 2016-08-08 LAB — GLUCOSE, CAPILLARY
GLUCOSE-CAPILLARY: 118 mg/dL — AB (ref 65–99)
Glucose-Capillary: 137 mg/dL — ABNORMAL HIGH (ref 65–99)

## 2016-08-08 SURGERY — LITHOTRIPSY, ESWL
Anesthesia: LOCAL | Laterality: Right

## 2016-08-08 MED ORDER — SODIUM CHLORIDE 0.9 % IV SOLN
INTRAVENOUS | Status: DC
  Administered 2016-08-08: 07:00:00 via INTRAVENOUS

## 2016-08-08 MED ORDER — DIAZEPAM 5 MG PO TABS
10.0000 mg | ORAL_TABLET | ORAL | Status: AC
  Administered 2016-08-08: 10 mg via ORAL
  Filled 2016-08-08: qty 2

## 2016-08-08 MED ORDER — DIPHENHYDRAMINE HCL 25 MG PO CAPS
25.0000 mg | ORAL_CAPSULE | ORAL | Status: AC
  Administered 2016-08-08: 25 mg via ORAL
  Filled 2016-08-08: qty 1

## 2016-08-08 MED ORDER — CIPROFLOXACIN HCL 500 MG PO TABS
500.0000 mg | ORAL_TABLET | ORAL | Status: AC
  Administered 2016-08-08: 500 mg via ORAL
  Filled 2016-08-08: qty 1

## 2016-08-08 NOTE — H&P (Signed)
CC: I have ureteral stone.  HPI: Dakota Sutton is a 50 year-old male patient who was referred by Vernona Rieger who is here for ureteral stone.  The problem is on the right side. He first stated noticing pain on 07/30/2016. This is not his first kidney stone. Pain is occuring on the right side.   He has had ureteroscopy for treatment of his stones in the past.   CT scan July 30, 2016 revealed a 4 mm right proximal ureteral stoneand a 2 mm right ureterovesical junction stone with stent in the bladder. No other stones on the right. Punctate stones and nephrocalcinosis on the left.his UA showed no bacteria but too numerous to count red cells. BUN 24, creatinine 1.46.   He hasn't seen a stone pass, but pain improved. Now some pain left lower back. UA clear.     ALLERGIES: None   MEDICATIONS: Omeprazole 20 mg capsule,delayed release  Simvastatin 40 mg tablet  Cinnamon  Lantus  Losartan Potassium 50 mg tablet  Multivitamin  Novolog     GU PSH: None   NON-GU PSH: Nose Surgery (Unspecified) Shoulder Surgery (Unspecified), Bilateral    GU PMH: Kidney Stone    NON-GU PMH: Cardiac murmur, unspecified Diabetes Type 2 GERD    FAMILY HISTORY: 1 son - Other   SOCIAL HISTORY: Marital Status: Divorced Current Smoking Status: Patient has never smoked.  Drinks 3 caffeinated drinks per day.    REVIEW OF SYSTEMS:    GU Review Male:   Patient denies leakage of urine, erection problems, penile pain, hard to postpone urination, get up at night to urinate, burning/ pain with urination, stream starts and stops, frequent urination, have to strain to urinate , and trouble starting your stream.  Gastrointestinal (Upper):   Patient reports nausea, vomiting, and indigestion/ heartburn.   Gastrointestinal (Lower):   Patient reports constipation. Patient denies diarrhea.  Constitutional:   Patient reports night sweats and fatigue. Patient denies fever and weight loss.  Skin:   Patient denies skin  rash/ lesion and itching.  Eyes:   Patient denies blurred vision and double vision.  Ears/ Nose/ Throat:   Patient denies sore throat and sinus problems.  Hematologic/Lymphatic:   Patient denies swollen glands and easy bruising.  Cardiovascular:   Patient denies leg swelling and chest pains.  Respiratory:   Patient denies cough and shortness of breath.  Endocrine:   Patient denies excessive thirst.  Musculoskeletal:   Patient reports back pain and joint pain.   Neurological:   Patient denies headaches and dizziness.  Psychologic:   Patient denies depression and anxiety.   VITAL SIGNS:      08/03/2016 01:20 PM  Weight 170 lb / 77.11 kg  Height 69 in / 175.26 cm  BP 145/79 mmHg  Heart Rate 86 /min  Temperature 98.3 F / 37 C  BMI 25.1 kg/m   MULTI-SYSTEM PHYSICAL EXAMINATION:    Constitutional: Well-nourished. No physical deformities. Normally developed. Good grooming.  Neck: Neck symmetrical, not swollen. Normal tracheal position.  Respiratory: No labored breathing, no use of accessory muscles.   Cardiovascular: Normal temperature, normal extremity pulses, no swelling, no varicosities.  Lymphatic: No enlargement of neck, axillae, groin.  Skin: No paleness, no jaundice, no cyanosis. No lesion, no ulcer, no rash.  Neurologic / Psychiatric: Oriented to time, oriented to place, oriented to person. No depression, no anxiety, no agitation.  Gastrointestinal: No mass, no tenderness, no rigidity, non obese abdomen.  Eyes: Normal conjunctivae. Normal eyelids.  Ears, Nose,  Mouth, and Throat: Left ear no scars, no lesions, no masses. Right ear no scars, no lesions, no masses. Nose no scars, no lesions, no masses. Normal hearing. Normal lips.  Musculoskeletal: Normal gait and station of head and neck.     PAST DATA REVIEWED:  Source Of History:  Healthcare Provider  Records Review:   Previous Doctor Records  X-Ray Review: C.T. Abdomen/Pelvis: Reviewed Films.     PROCEDURES:         KUB -  74000  A single view of the abdomen is obtained.  Bony Abnormalities:  none  Fecal Stasis:  normal bowel gas pattern  Soft Tissue:  unremarkable   Calculi:  Proximal right ureter calculi 4-5 mm at top of L4.                Urinalysis w/Scope - 81001 Dipstick Dipstick Cont'd Micro  Specimen: Voided Bilirubin: Neg WBC/hpf: 0-5/hpf  Color: Yellow Ketones: Trace RBC/hpf: NS (Not Seen)  Appearance: Clear Blood: trace Bacteria: NS (Not Seen)  Specific Gravity: 1.025 Protein: Trace Casts: NS (Not Seen)  pH: 5.5 Urobilinogen: 0.2 Trichomonas: Not Present  Glucose: 3+ Nitrites: Neg Mucous: Present    Leukocyte Esterase: Neg Epithelial Cells: NS (Not Seen)      Yeast: NS (Not Seen)      Sperm: Not Present    ASSESSMENT:      ICD-10 Details  1 GU:   Kidney Stone - N20.0   2   Calculus Ureter - N20.1    PLAN:           Orders X-Rays: KUB          Schedule         Document Letter(s):  Created for Patient: Clinical Summary         Notes:   Discussed with patient nature r/b of surveillance, URS or ESWL. He wants to set up ESWL for the RIGHT PROXIMAL stone at L4. I believe he likely passed the 2 mm right uvj stone as his pain improved and his UA cleared.    After a thorough review of the management options for the patient's condition the patient  elected to proceed with surgical therapy as noted above. We have discussed the potential benefits and risks of the procedure, side effects of the proposed treatment, the likelihood of the patient achieving the goals of the procedure, and any potential problems that might occur during the procedure or recuperation. Informed consent has been obtained.

## 2016-08-08 NOTE — Discharge Instructions (Signed)
I have reviewed discharge instructions in detail with the patient. They will follow-up with me or their physician as scheduled. My nurse will also be calling the patients as per protocol.  ° °Moderate Conscious Sedation, Adult, Care After °Refer to this sheet in the next few weeks. These instructions provide you with information on caring for yourself after your procedure. Your health care provider may also give you more specific instructions. Your treatment has been planned according to current medical practices, but problems sometimes occur. Call your health care provider if you have any problems or questions after your procedure. °WHAT TO EXPECT AFTER THE PROCEDURE  °After your procedure: °· You may feel sleepy, clumsy, and have poor balance for several hours. °· Vomiting may occur if you eat too soon after the procedure. °HOME CARE INSTRUCTIONS °· Do not participate in any activities where you could become injured for at least 24 hours. Do not: °¨ Drive. °¨ Swim. °¨ Ride a bicycle. °¨ Operate heavy machinery. °¨ Cook. °¨ Use power tools. °¨ Climb ladders. °¨ Work from a high place. °· Do not make important decisions or sign legal documents until you are improved. °· If you vomit, drink water, juice, or soup when you can drink without vomiting. Make sure you have little or no nausea before eating solid foods. °· Only take over-the-counter or prescription medicines for pain, discomfort, or fever as directed by your health care provider. °· Make sure you and your family fully understand everything about the medicines given to you, including what side effects may occur. °· You should not drink alcohol, take sleeping pills, or take medicines that cause drowsiness for at least 24 hours. °· If you smoke, do not smoke without supervision. °· If you are feeling better, you may resume normal activities 24 hours after you were sedated. °· Keep all appointments with your health care provider. °SEEK MEDICAL CARE IF: °· Your  skin is pale or bluish in color. °· You continue to feel nauseous or vomit. °· Your pain is getting worse and is not helped by medicine. °· You have bleeding or swelling. °· You are still sleepy or feeling clumsy after 24 hours. °SEEK IMMEDIATE MEDICAL CARE IF: °· You develop a rash. °· You have difficulty breathing. °· You develop any type of allergic problem. °· You have a fever. °MAKE SURE YOU: °· Understand these instructions. °· Will watch your condition. °· Will get help right away if you are not doing well or get worse. °  °This information is not intended to replace advice given to you by your health care provider. Make sure you discuss any questions you have with your health care provider. °  °Document Released: 07/31/2013 Document Revised: 10/31/2014 Document Reviewed: 07/31/2013 °Elsevier Interactive Patient Education ©2016 [employer] ° °

## 2016-08-08 NOTE — Interval H&P Note (Signed)
History and Physical Interval Note:  08/08/2016 7:15 AM  Dakota ShirtsScott Lashley  has presented today for surgery, with the diagnosis of RIGHT URETERAL STONE  The various methods of treatment have been discussed with the patient and family. After consideration of risks, benefits and other options for treatment, the patient has consented to  Procedure(s): RIGHT EXTRACORPOREAL SHOCK WAVE LITHOTRIPSY (ESWL) (Right) as a surgical intervention .  The patient's history has been reviewed, patient examined, no change in status, stable for surgery.  I have reviewed the patient's chart and labs.  Questions were answered to the patient's satisfaction.     Bryla Burek,Dacari A

## 2016-08-18 ENCOUNTER — Ambulatory Visit (INDEPENDENT_AMBULATORY_CARE_PROVIDER_SITE_OTHER): Payer: BLUE CROSS/BLUE SHIELD | Admitting: Internal Medicine

## 2016-08-18 ENCOUNTER — Encounter: Payer: Self-pay | Admitting: Internal Medicine

## 2016-08-18 VITALS — BP 118/80 | HR 103 | Ht 69.5 in | Wt 169.0 lb

## 2016-08-18 DIAGNOSIS — N182 Chronic kidney disease, stage 2 (mild): Secondary | ICD-10-CM | POA: Diagnosis not present

## 2016-08-18 DIAGNOSIS — E1022 Type 1 diabetes mellitus with diabetic chronic kidney disease: Secondary | ICD-10-CM | POA: Diagnosis not present

## 2016-08-18 DIAGNOSIS — E1065 Type 1 diabetes mellitus with hyperglycemia: Secondary | ICD-10-CM | POA: Diagnosis not present

## 2016-08-18 DIAGNOSIS — IMO0002 Reserved for concepts with insufficient information to code with codable children: Secondary | ICD-10-CM

## 2016-08-18 LAB — POCT GLYCOSYLATED HEMOGLOBIN (HGB A1C): Hemoglobin A1C: 6.9

## 2016-08-18 MED ORDER — INSULIN PEN NEEDLE 32G X 4 MM MISC: 11 refills | Status: AC

## 2016-08-18 MED ORDER — INSULIN ASPART 100 UNIT/ML FLEXPEN: PEN_INJECTOR | SUBCUTANEOUS | 5 refills | Status: AC

## 2016-08-18 NOTE — Progress Notes (Signed)
Patient ID: Dakota Sutton, male   DOB: 03/07/1966, 50 y.o.   MRN: [id number]  HPI: Dakota Sutton is a 50 y.o.-year-old male, initially referred by his cardiologist, Dr Dakota Sutton, for management of DM, dx in 2002, insulin-dependent since 2006, uncontrolled, with complications (hypoglycemia, CKD stage 2). Last visit 3 mo ago. He moved to [facility] from [place] in July 2016. Last visit 3 mo ago.  He had 2 kidney stones since last visit >> lithotripsy >> did not feel good after this (nausea, decreased appetite, AP). Sees urology now - DR. Eskridge.  Last hemoglobin A1c was: Lab Results  Component Value Date   HGBA1C 7.0 05/18/2016   HGBA1C 8.4 02/04/2016   HGBA1C 10.3 (H) 11/12/2015   At last visit, we adjusted his insulin regimen, by adding Novolog and decreasing Lantus:  - Lantus 25 >> 22 units at bedtime - Novolog - ICR 1:15 (minus 1 unit), ISF 50, target 150 - ends up with 6-7  - Sliding scale: 150-200: + 1 unit 201-250: + 2 units 251-300: + 3 units 301-350: + 4 units >350: + 5 units Previously on Metformin, Avandia, Glyset.  He was on Novolog 4-6 units with meals >> stopped Novolog in 2016. He was told Novolog did not work for him (???) He had DKA in 10/2014 (!) >> ICU.   Pt checks his sugars 3 a day and they are better per detailed log: - am: 65, 80-223, 301 >> 46x1, 72-211, 249 (4 units) >> 43, 160s, 202, 260 >> 42, 48, 66,80-228, 265 - 2h after b'fast: 45, 54, 100-146 >> 60 >> n/c - before lunch: 48, 56, 60, 81-187, 185, 301, 313 >> 58, 61-157, 236 >> 55-176, 229 (6-7 units) >> 80-100 >> 37, 53-165, 283x1 - 2h after lunch: 194-338, 451 >> 63 >> 38 >> n/c - before dinner: 211-378 >> 60-226, 271, 317 >> 59-300 (6-7 units) >> 140-160 >> 51-224, 341 - 2h after dinner: 58, 60, 68, 76-354 >> 44-52, 189, 363 >> n/c - bedtime: n/c  >> 49x1 >> 53 >> n/c - nighttime: n/c + lows. Lowest sugar was 48 >> 49 >> 38x1 >> 43 >> 37; he has hypoglycemia awareness at 50-60.  Highest sugar was 451 >> 300s >>  285.  Glucometer: OneTouch  Pt's meals are - about the same every day, per his report: - Breakfast: Iced coffee, breakfast bowl or eggs, sausage, toast (4-5 units) - Lunch: Sandwich, chips, diet soda, peanut butter bars (6-7 units) - Dinner: Meat, vegetable, starch, diet soda (6-7 units) - Snacks: 3  He has a very physical work, plays soccer sometimes in the evening.  - + CKD stage 2, last BUN/creatinine:  Lab Results  Component Value Date   BUN 24 (H) 07/30/2016   CREATININE 1.46 (H) 07/30/2016  He is on Losartan. - last set of lipids: Lab Results  Component Value Date   CHOL 165 03/24/2016   HDL 65.70 03/24/2016   LDLCALC 91 03/24/2016   TRIG 44.0 03/24/2016   CHOLHDL 3 03/24/2016    He is on Zocor. - last eye exam was in 11/28/2015. No DR.  - + occasional numbness and tingling in his feet.  He also has a history of hypertension and hyperlipidemia.  ROS: Constitutional: no weight gain/loss, no fatigue, no subjective hyperthermia/hypothermia Eyes: no blurry vision, no xerophthalmia ENT: no sore throat, no nodules palpated in throat, no dysphagia/odynophagia, no hoarseness Cardiovascular: no CP/SOB/palpitations/leg swelling Respiratory: no cough/SOB Gastrointestinal: no N/V/D/C Musculoskeletal: no muscle/joint aches Skin: no rashes Neurological:  no tremors/numbness/tingling/dizziness  I reviewed pt's medications, allergies, PMH, social hx, family hx, and changes were documented in the history of present illness. Otherwise, unchanged from my initial visit note.  Past Medical History:  Diagnosis Date  . Chicken pox   . Depression   . Diabetes mellitus without complication (HCC)   . Diabetic ketoacidosis (HCC) 10/2014   Hospitalized in [place]   . Heart murmur   . Hyperlipidemia   . Hypertension   . Kidney stone    Past Surgical History:  Procedure Laterality Date  . CARDIAC CATHETERIZATION N/A 11/25/2015   Procedure: Left Heart Cath and Coronary  Angiography;  Surgeon: Laurey Moralealton S McLean, MD;  [facility];  Service: Cardiovascular;  Laterality: N/A;  . CYSTOSCOPY WITH STENT PLACEMENT    . RHINOPLASTY    . ROTATOR CUFF REPAIR Bilateral    Social History Main Topics  . Smoking status: Never Smoker   . Smokeless tobacco: Not on file  . Alcohol Use: 0.0 oz/week    0 Standard drinks or equivalent per week     Comment: social  . Drug Use: No   Social History Narrative   Moved from [place].   MBA. Works as a Production designer, theatre/television/filmmanager.   1 child that lives in [place].   Enjoys bowling, softball.      Current Outpatient Prescriptions on File Prior to Visit  Medication Sig Dispense Refill  . CINNAMON PO Take 1,000 mg by mouth daily.    . fluticasone (FLONASE) 50 MCG/ACT nasal spray Place 2 sprays into both nostrils daily as needed for allergies or rhinitis. 16 g 6  . insulin aspart (NOVOLOG) 100 UNIT/ML injection Use 42 units a day via pump. (Patient taking differently: Inject 4-20 Units into the skin daily. Patient is on a sliding scale) 60 mL 3  . Insulin Pen Needle (NOVOFINE PLUS) 32G X 4 MM MISC Use 4x a day 300 each 11  . LANTUS SOLOSTAR 100 UNIT/ML Solostar Pen Inject 22 Units into the skin at bedtime.   5  . losartan (COZAAR) 50 MG tablet Take 1 tablet (50 mg total) by mouth daily. 90 tablet 1  . Multiple Vitamins-Minerals (MULTIVITAMIN WITH MINERALS) tablet Take 1 tablet by mouth daily.    Marland Kitchen. omeprazole (PRILOSEC) 20 MG capsule Take 1 capsule (20 mg total) by mouth daily. 90 capsule 3  . ONE TOUCH ULTRA TEST test strip Used to check sugar 3 times a day. 100 each 2  . simvastatin (ZOCOR) 40 MG tablet Take 1 tablet (40 mg total) by mouth daily. 90 tablet 2  . tamsulosin (FLOMAX) 0.4 MG CAPS capsule Take 1 capsule (0.4 mg total) by mouth daily after supper. 7 capsule 0  . glucagon (GLUCAGON EMERGENCY) 1 MG injection Inject 1 mg into the vein once as needed. (Patient not taking: Reported on 08/18/2016) 2 each prn  . meloxicam (MOBIC) 15 MG  tablet Take 1 tablet (15 mg total) by mouth daily as needed for pain. (Patient not taking: Reported on 08/18/2016) 90 tablet 1  . ondansetron (ZOFRAN ODT) 4 MG disintegrating tablet Take 1 tablet (4 mg total) by mouth every 8 (eight) hours as needed for nausea or vomiting. (Patient not taking: Reported on 08/18/2016) 20 tablet 0  . ondansetron (ZOFRAN) 4 MG tablet Take 1 tablet (4 mg total) by mouth every 6 (six) hours. (Patient not taking: Reported on 08/18/2016) 12 tablet 0  . oxyCODONE-acetaminophen (PERCOCET/ROXICET) 5-325 MG tablet Take 1-2 tablets by mouth every 6 (six) hours  as needed for severe pain. (Patient not taking: Reported on 08/18/2016) 40 tablet 0  . promethazine (PHENERGAN) 12.5 MG tablet Take 1-2 tablets (12.5-25 mg total) by mouth every 8 (eight) hours as needed for nausea or vomiting. (Patient not taking: Reported on 08/18/2016) 20 tablet 0   No current facility-administered medications on file prior to visit.    No Known Allergies Family History  Problem Relation Age of Onset  . Heart disease Father   . Mitral valve prolapse Father   . Lung cancer Maternal Aunt   . Emphysema Maternal Grandmother   . Lung cancer Maternal Grandmother   . Diabetes Paternal Grandmother   . Heart attack Paternal Grandfather    PE: BP 118/80 (BP Location: Left Arm, Patient Position: Sitting)   Pulse (!) 103   Ht 5' 9.5" (1.765 m)   Wt 169 lb (76.7 kg)   SpO2 97%   BMI 24.60 kg/m  Body mass index is 24.6 kg/m.  Wt Readings from Last 3 Encounters:  08/18/16 169 lb (76.7 kg)  08/08/16 165 lb 8 oz (75.1 kg)  08/01/16 170 lb 1.9 oz (77.2 kg)   Constitutional: Normal weight, in NAD Eyes: PERRLA, EOMI, no exophthalmos ENT: moist mucous membranes, no thyromegaly, no cervical lymphadenopathy Cardiovascular: RRR, No MRG Respiratory: CTA B Gastrointestinal: abdomen soft, NT, ND, BS+ Musculoskeletal: no deformities, strength intact in all 4 Skin: moist, warm, no rashes Neurological: no  tremor with outstretched hands, DTR normal in all 4  ASSESSMENT: 1. DM1, insulin-dependent, uncontrolled, with complications  - hypoglycemia - CKD stage 2  Component     Latest Ref Rng 12/16/2015  C-Peptide     0.80-3.85 ng/mL 0.13 (L)  Glucose, Fasting     65 - 99 mg/dL 161 (H)  Glutamic Acid Decarb Ab     <5 IU/mL <5  Pancreatic Islet Cell Antibody     < 5 JDF Units <5   - We discussed about an insulin pump at a previous visit >> not interested in this. I mentioned the Omnipod, which does not have any tubing and allows him to be active, and he was not opposed to this in theory.   PLAN:  1. Patient with long-standing, uncontrolled diabetes, which we dx'ed as DM1. He was briefly on an insulin pump (Medtronic [id number]) >> came off as he had pbs inserting the canula. He is now on Lantus and Novolog. Sugars are still low >> will decrease the Lantus and advised him to schedule a new appt with Cristy Folks to see if he can restart the pump. - he is doing a great job checking his sugars frequently, having approximately the same type of meals and the same number of carbs with his meals.  - I suggested to:  Patient Instructions  Please decrease: - Lantus to 20 units at bedtime  Please continue: - Novolog  ICR 1:15 (but subtract 1 unit from the b'fast dose only), ISF 50  Schedule an appt with Bonita Quin when she comes back.  Please return in 3 months with your sugar log.   - continue checking sugars at different times of the day - check 3 times a day, rotating checks - advised for yearly eye exams >> he is up-to-date - had flu shot this season - checked HbA1c today >> 6.9% (at goal) - Return to clinic in 3 mo with sugar log   Carlus Pavlov, MD PhD [facility] Endocrin[facility]

## 2016-08-18 NOTE — Addendum Note (Signed)
Addended by: Darene LamerHOMPSON, Vilma Will T on: 08/18/2016 10:12 AM   Modules accepted: Orders

## 2016-08-18 NOTE — Patient Instructions (Addendum)
Please decrease: - Lantus to 20 units at bedtime  Please continue: - Novolog  ICR 1:15 (but subtract 1 unit from the b'fast dose only), ISF 50  Schedule an appt with Bonita QuinLinda when she comes back.  Please return in 3 months with your sugar log.

## 2016-08-31 ENCOUNTER — Other Ambulatory Visit: Payer: Self-pay | Admitting: Primary Care

## 2016-08-31 DIAGNOSIS — E785 Hyperlipidemia, unspecified: Secondary | ICD-10-CM

## 2016-08-31 DIAGNOSIS — I10 Essential (primary) hypertension: Secondary | ICD-10-CM

## 2016-09-02 ENCOUNTER — Encounter: Payer: Self-pay | Admitting: Primary Care

## 2016-09-05 ENCOUNTER — Encounter: Payer: BLUE CROSS/BLUE SHIELD | Admitting: Nutrition

## 2016-11-18 ENCOUNTER — Ambulatory Visit: Payer: BLUE CROSS/BLUE SHIELD | Admitting: Internal Medicine

## 2017-09-11 IMAGING — DX DG CHEST 2V
2 series · 2 of 2 positions shown · non-contrast
Comparison: None.

CLINICAL DATA: Short of breath

EXAM:
CHEST  2 VIEW

[chest pa]
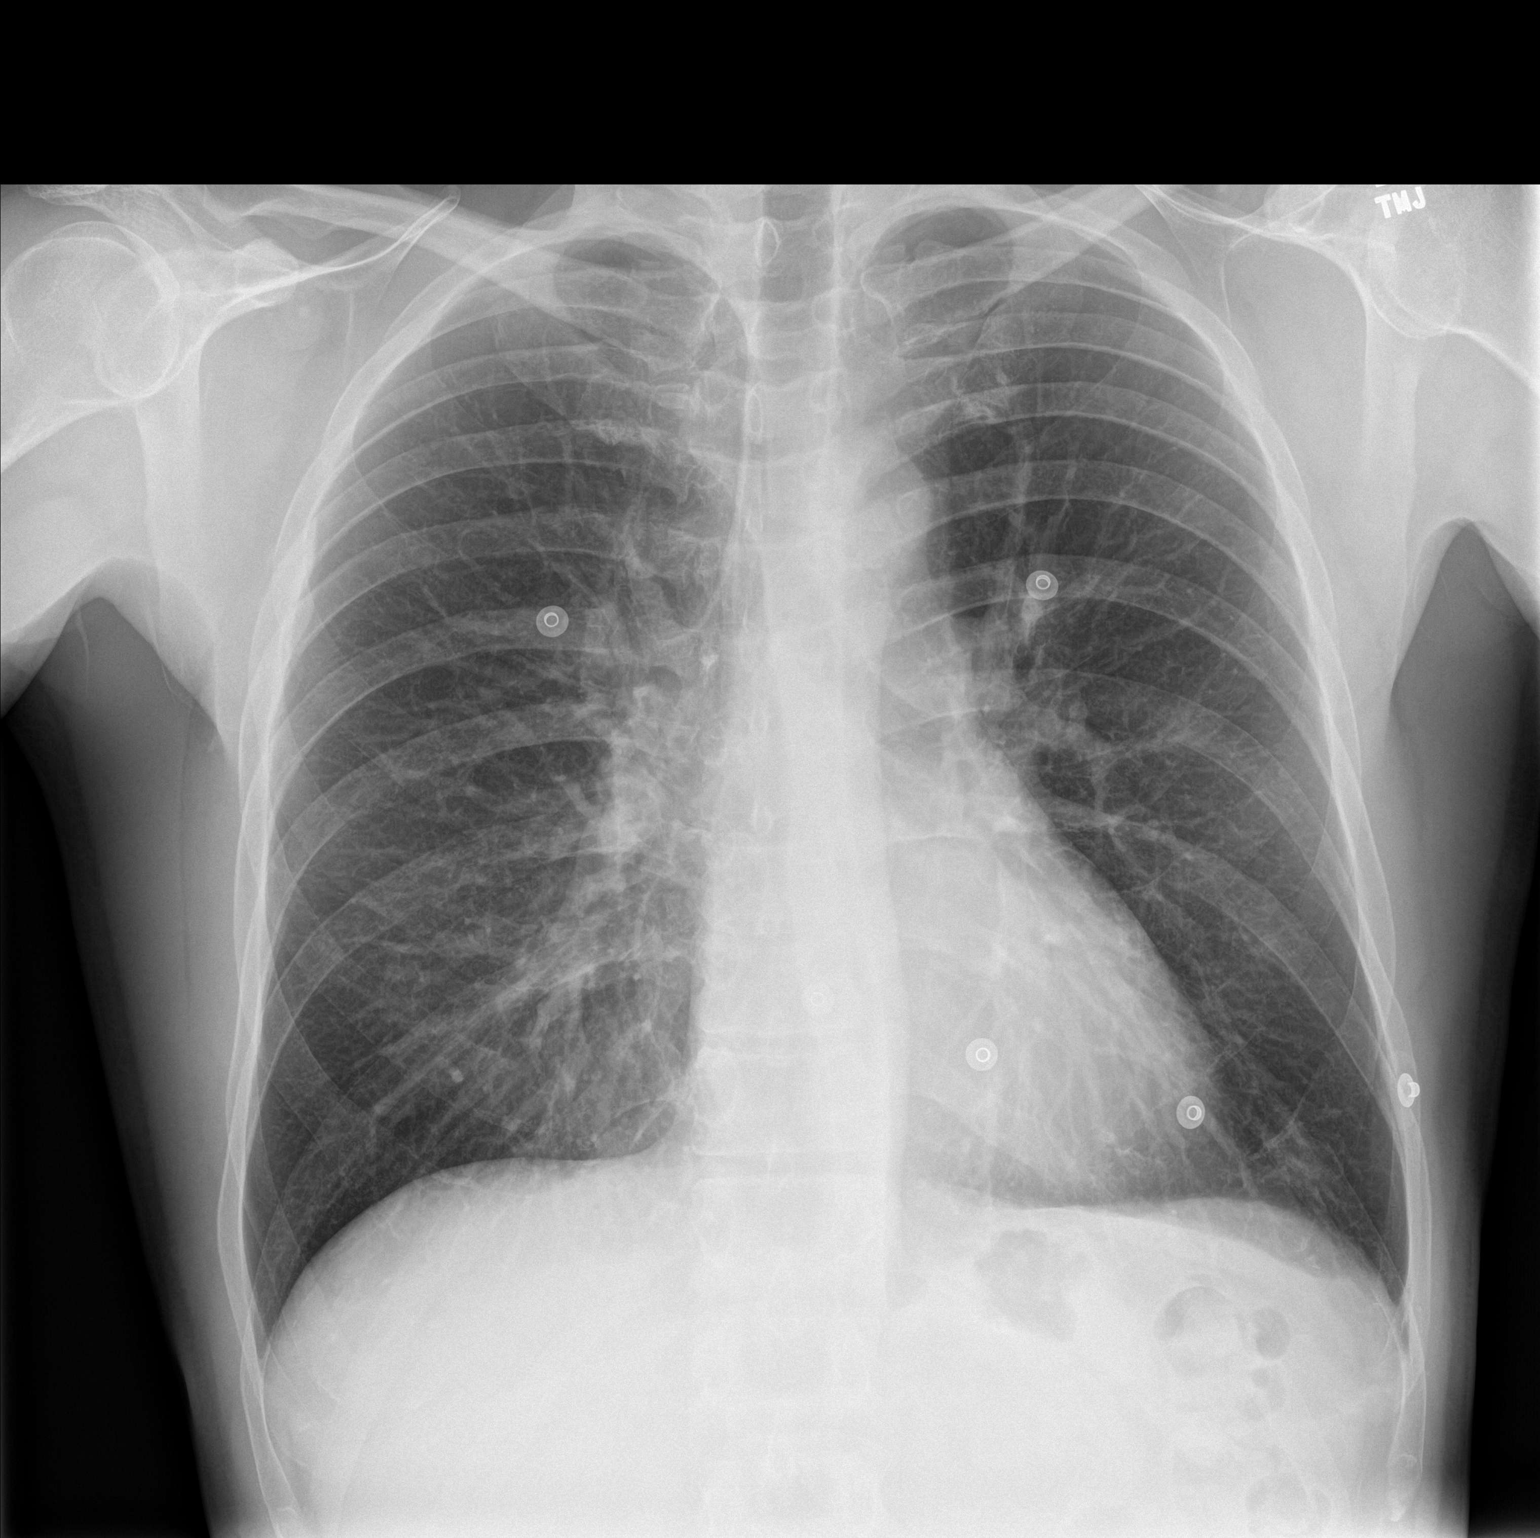

[chest lat]
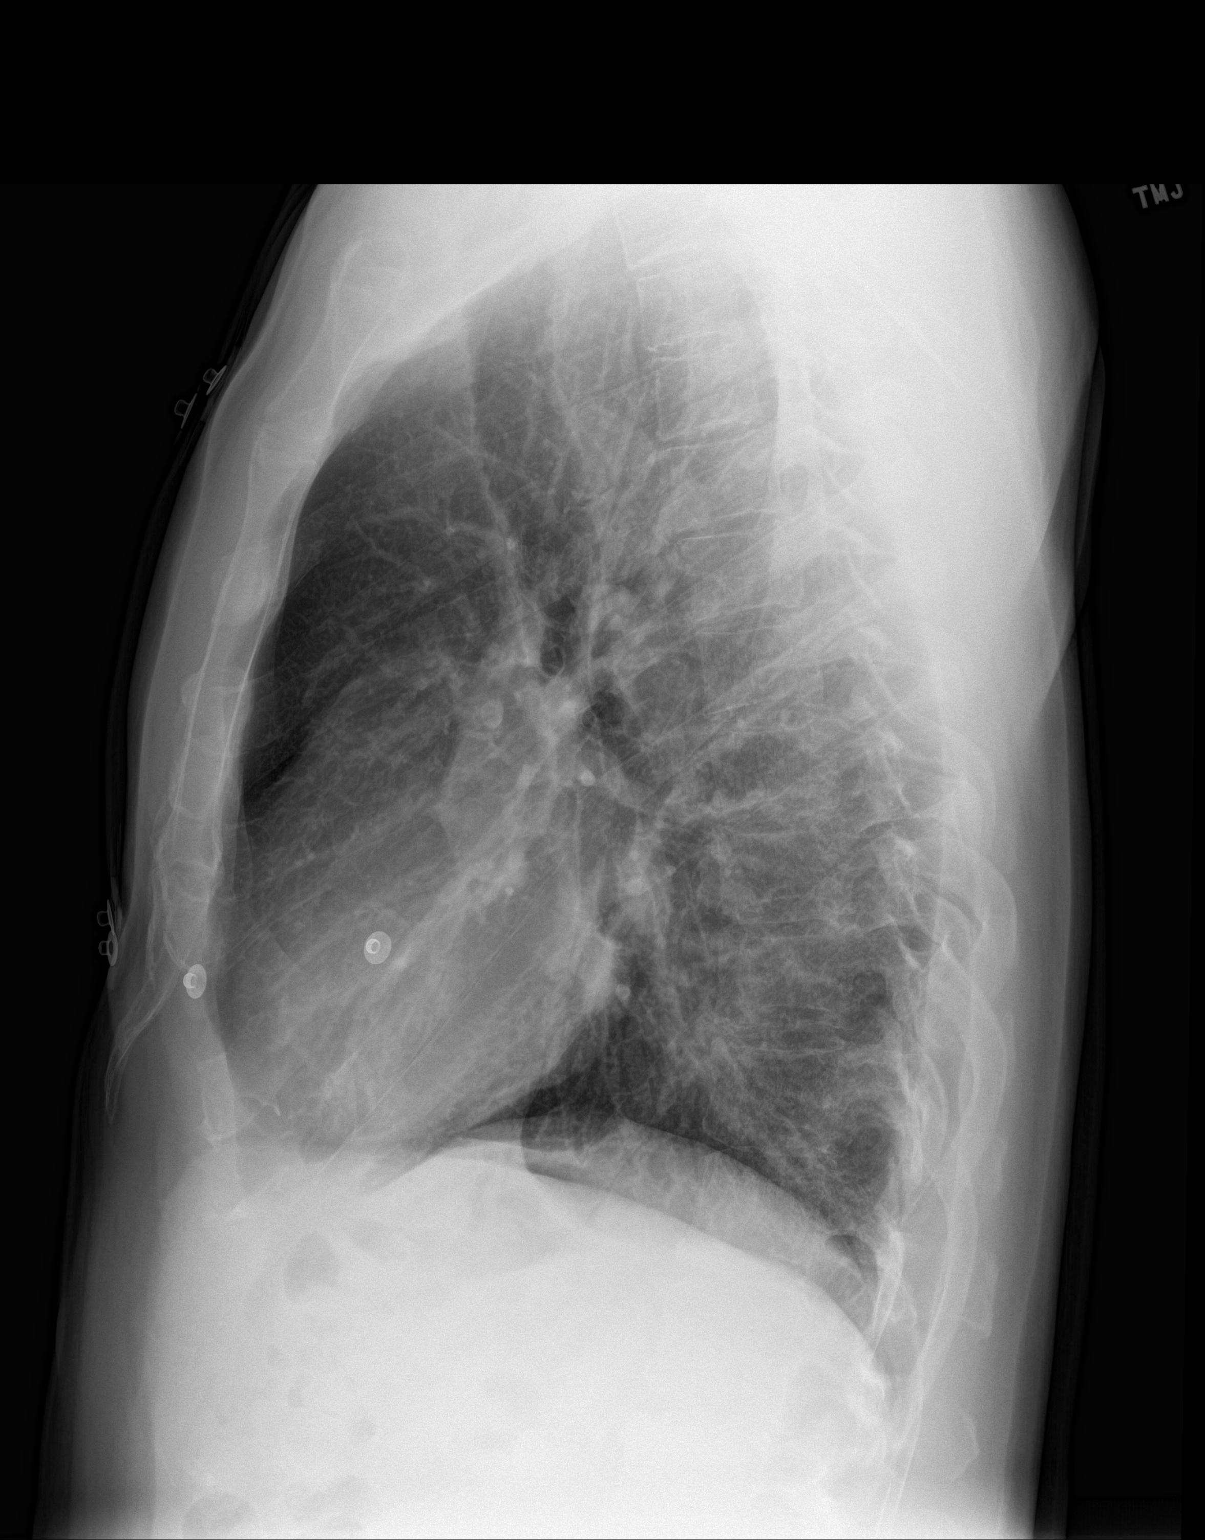

[2 of 2 positions shown; findings below may reference images not displayed]

FINDINGS: The heart size and mediastinal contours are within normal limits.
Both lungs are clear. The visualized skeletal structures are
unremarkable.
IMPRESSION: No active cardiopulmonary disease.

## 2017-09-11 IMAGING — CT CT ANGIO CHEST
2 of 6 series · 18 of 36 positions shown · IV contrast (Omni 300)
Comparison: Chest radiographs 0380 hours today.

ADDENDUM:
There should also be an Impression # 4 which reads as follows:

4. Right lower lobe lateral segment 4 mm lung nodule. If the patient
is at high risk for bronchogenic carcinoma, follow-up chest CT at 1
year is recommended. If the patient is at low risk, no follow-up is
needed. This recommendation follows the consensus statement:
Guidelines for Management of Small Pulmonary Nodules Detected on CT
Scans: A Statement from the [HOSPITAL] as published in
This addition was discussed by telephone with Dr. Liset Simmonds on
11/02/2015 at 9998 hours.
CLINICAL DATA: 49-year-old male with sudden onset shortness of
Breath and dizziness at 1228 hours. Initial encounter.
EXAM:
CT ANGIOGRAPHY CHEST WITH CONTRAST
TECHNIQUE: Multidetector CT imaging of the chest was performed using the
standard protocol during bolus administration of intravenous
contrast. Multiplanar CT image reconstructions and MIPs were
obtained to evaluate the vascular anatomy.
CONTRAST:  75mL OMNIPAQUE IOHEXOL 350 MG/ML SOLN

[Series 6: pe thins · axial · 0.66mm/px · z∈[+1189,+1446]mm · 17 of 571 slices shown]
[im 28/571  lung]
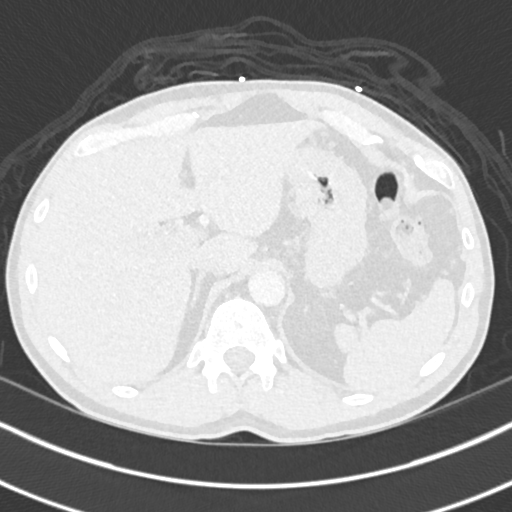
[im 55/571  mediastinal]
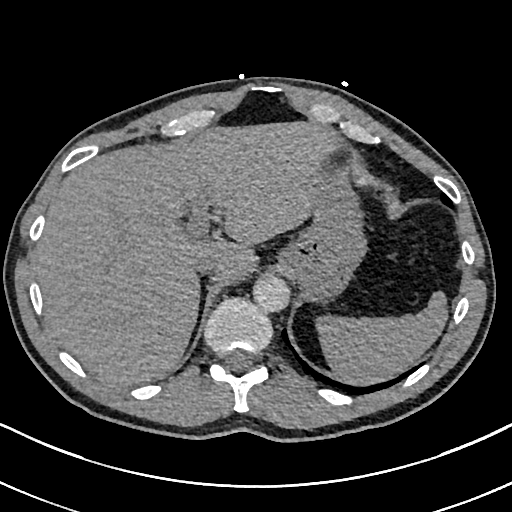
[im 82/571  lung]
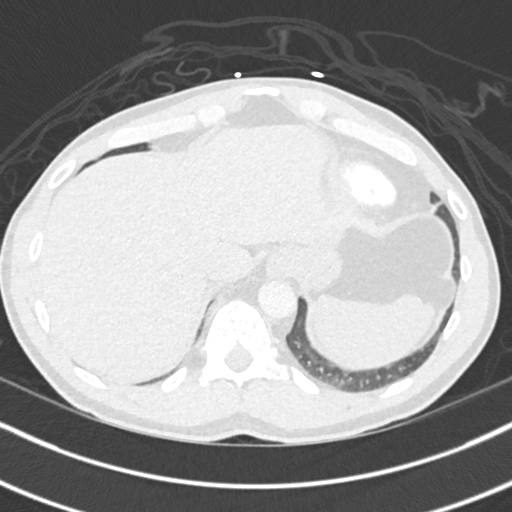
[im 136/571  mediastinal]
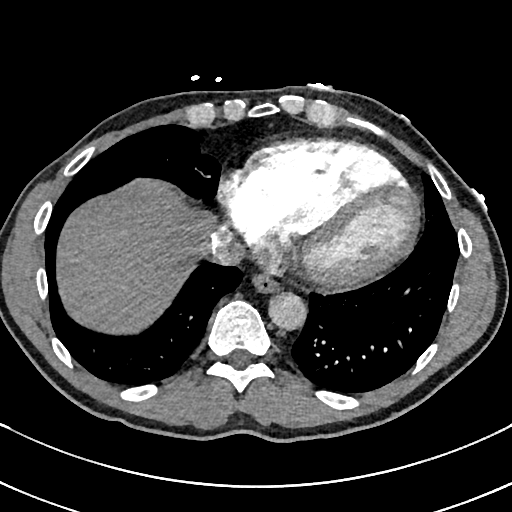
[im 163/571  lung]
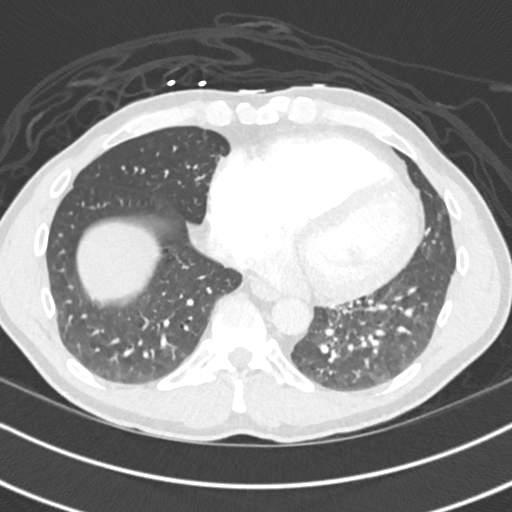
[im 191/571  mediastinal]
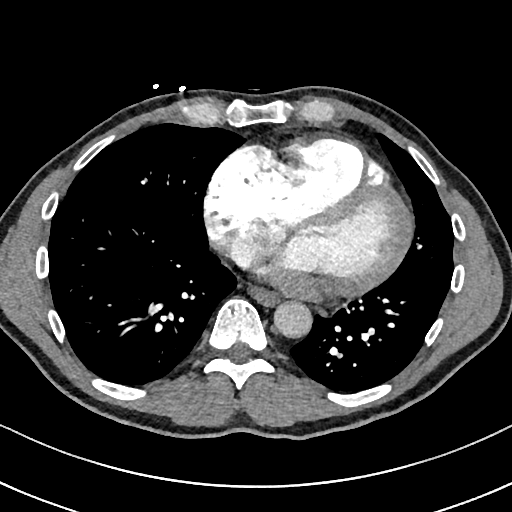
[im 218/571  lung]
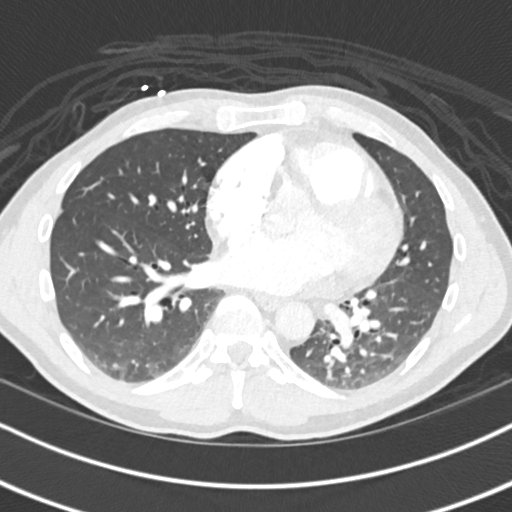
[im 245/571  mediastinal]
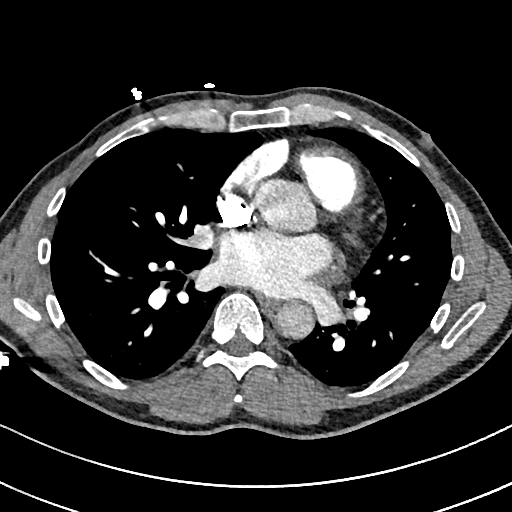
[im 299/571  lung]
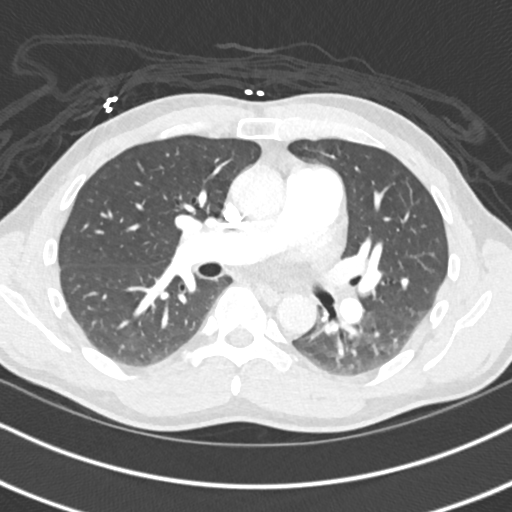
[im 326/571  mediastinal]
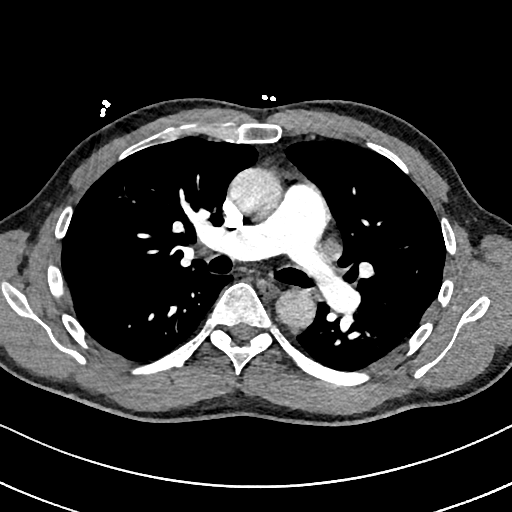
[im 353/571  lung]
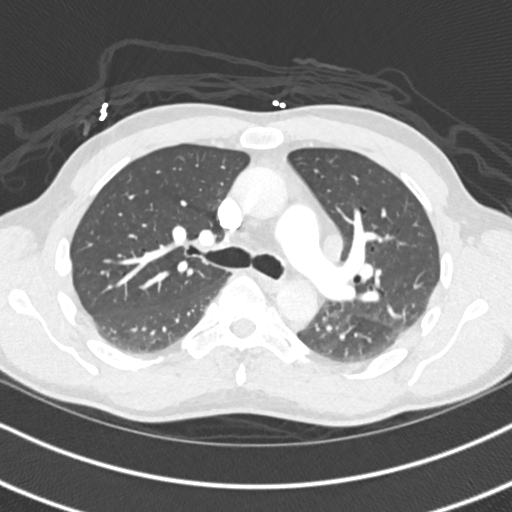
[im 381/571  mediastinal]
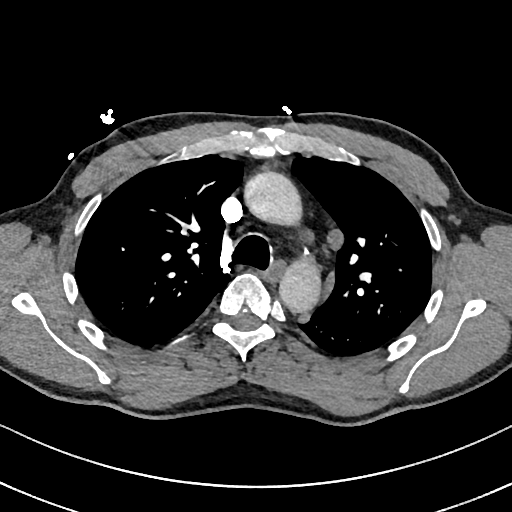
[im 408/571  lung]
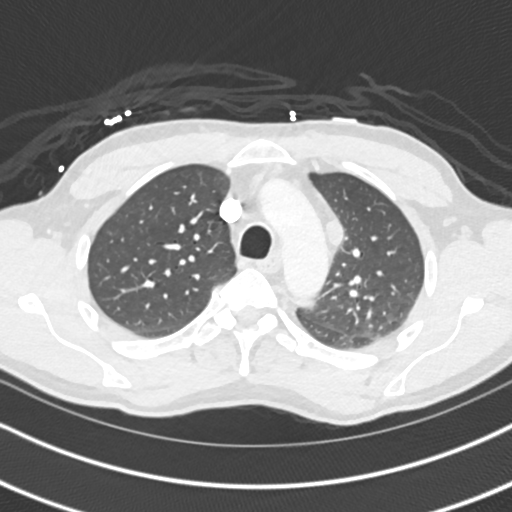
[im 435/571  mediastinal]
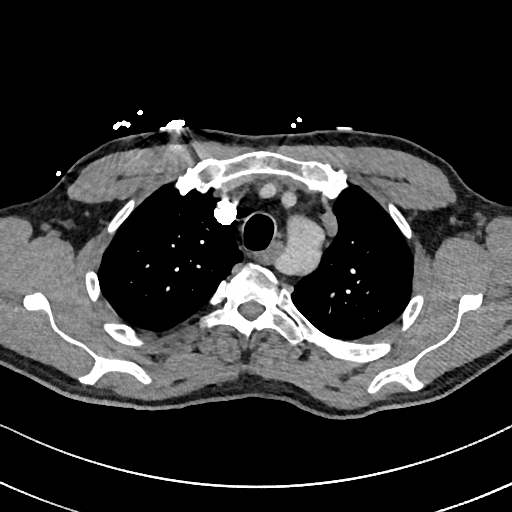
[im 489/571  lung]
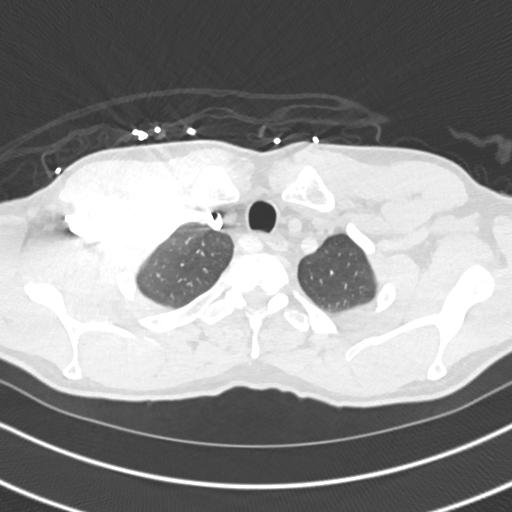
[im 516/571  mediastinal]
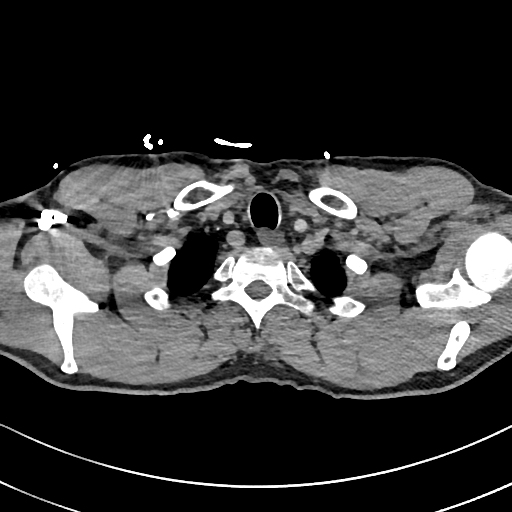
[im 543/571  lung]
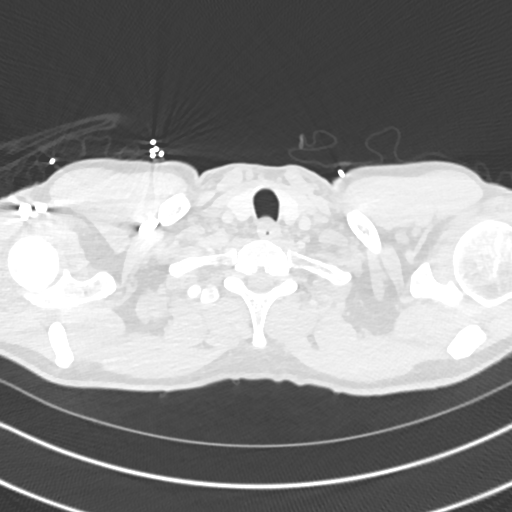

[Series 7: pe 2mm cor · coronal · 0.68mm/px · 1 of 113 slices shown]
[im 57/113  mediastinal]
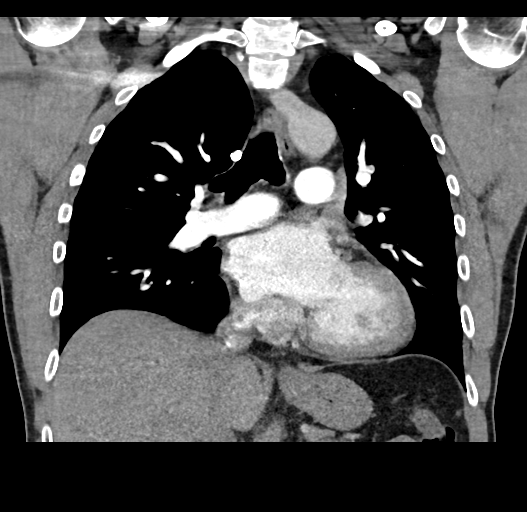

[18 of 36 positions shown; findings below may reference images not displayed]

FINDINGS: Adequate contrast bolus timing in the pulmonary arterial tree.

No focal filling defect identified in the pulmonary arterial tree to
suggest the presence of acute pulmonary embolism.

Major airways are patent. Mild patchy and dependent bilateral lower
lobe pulmonary opacity. 4 mm right lower lobe pulmonary nodule on
series 5, image 87. Adjacent tiny triangular thickening of the right
fissure likely is benign (such as a small pleural node). Mild
scarring or atelectasis in the medial segment of the right middle
lobe. No other abnormal pulmonary opacity.

No pleural effusion. Cardiomegaly. No pericardial effusion. No
calcified coronary artery atherosclerosis is evident. No significant
calcified aortic atherosclerosis. Aberrant origin of the right
subclavian artery and persistent left side SVC incidentally noted.
Negative thoracic inlet. No mediastinal or hilar lymphadenopathy.

No axillary lymphadenopathy. Negative visualized liver, spleen,
pancreas, adrenal glands, and bowel in the upper abdomen.

No acute osseous abnormality identified.

Review of the MIP images confirms the above findings.
IMPRESSION: 1.  No evidence of acute pulmonary embolus.
2. Mild nonspecific lower lobe and depending ground-glass opacity,
might simply reflect atelectasis. Otherwise consider viral/atypical
infectious alveolitis. No pleural effusion.
3. Cardiomegaly. Incidental normal anatomic variants of persistent
left SVC and aberrant right subclavian artery origin.
